# Patient Record
Sex: Female | Born: 1980 | Race: White | Hispanic: No | Marital: Married | Smoking: Never smoker
Health system: Southern US, Community
[De-identification: ages and names within clinical notes are randomized; demographics above are authoritative.]

## PROBLEM LIST (undated history)

## (undated) DIAGNOSIS — J45909 Unspecified asthma, uncomplicated: Secondary | ICD-10-CM

## (undated) DIAGNOSIS — A048 Other specified bacterial intestinal infections: Secondary | ICD-10-CM

## (undated) DIAGNOSIS — R102 Pelvic and perineal pain: Secondary | ICD-10-CM

## (undated) DIAGNOSIS — B9689 Other specified bacterial agents as the cause of diseases classified elsewhere: Secondary | ICD-10-CM

## (undated) DIAGNOSIS — R519 Headache, unspecified: Secondary | ICD-10-CM

## (undated) DIAGNOSIS — I10 Essential (primary) hypertension: Secondary | ICD-10-CM

## (undated) DIAGNOSIS — R51 Headache: Secondary | ICD-10-CM

## (undated) DIAGNOSIS — N760 Acute vaginitis: Secondary | ICD-10-CM

## (undated) HISTORY — PX: TONSILLECTOMY: SUR1361

## (undated) HISTORY — PX: TUBAL LIGATION: SHX77

## (undated) HISTORY — DX: Other specified bacterial intestinal infections: A04.8

## (undated) HISTORY — PX: OTHER SURGICAL HISTORY: SHX169

## (undated) HISTORY — PX: ANKLE FRACTURE SURGERY: SHX122

## (undated) HISTORY — DX: Other specified bacterial agents as the cause of diseases classified elsewhere: B96.89

## (undated) HISTORY — DX: Pelvic and perineal pain: R10.2

## (undated) HISTORY — DX: Acute vaginitis: N76.0

---

## 2000-03-27 DIAGNOSIS — F329 Major depressive disorder, single episode, unspecified: Secondary | ICD-10-CM | POA: Insufficient documentation

## 2000-12-31 ENCOUNTER — Emergency Department (HOSPITAL_COMMUNITY): Admission: EM | Admit: 2000-12-31 | Discharge: 2000-12-31 | Payer: Self-pay | Admitting: Emergency Medicine

## 2004-11-17 ENCOUNTER — Ambulatory Visit (HOSPITAL_COMMUNITY): Admission: RE | Admit: 2004-11-17 | Discharge: 2004-11-17 | Payer: Self-pay | Admitting: Family Medicine

## 2004-12-12 ENCOUNTER — Ambulatory Visit: Payer: Self-pay | Admitting: Orthopedic Surgery

## 2005-01-17 ENCOUNTER — Ambulatory Visit (HOSPITAL_COMMUNITY): Admission: RE | Admit: 2005-01-17 | Discharge: 2005-01-17 | Payer: Self-pay | Admitting: Pulmonary Disease

## 2005-08-24 ENCOUNTER — Ambulatory Visit (HOSPITAL_COMMUNITY): Admission: RE | Admit: 2005-08-24 | Discharge: 2005-08-24 | Payer: Self-pay | Admitting: Family Medicine

## 2006-08-20 ENCOUNTER — Ambulatory Visit (HOSPITAL_COMMUNITY): Admission: RE | Admit: 2006-08-20 | Discharge: 2006-08-20 | Payer: Self-pay | Admitting: *Deleted

## 2006-12-28 ENCOUNTER — Encounter (INDEPENDENT_AMBULATORY_CARE_PROVIDER_SITE_OTHER): Payer: Self-pay | Admitting: *Deleted

## 2006-12-28 ENCOUNTER — Emergency Department (HOSPITAL_COMMUNITY): Admission: EM | Admit: 2006-12-28 | Discharge: 2006-12-28 | Payer: Self-pay | Admitting: Emergency Medicine

## 2007-07-08 ENCOUNTER — Inpatient Hospital Stay (HOSPITAL_COMMUNITY): Admission: EM | Admit: 2007-07-08 | Discharge: 2007-07-09 | Payer: Self-pay | Admitting: Emergency Medicine

## 2007-07-08 ENCOUNTER — Encounter: Payer: Self-pay | Admitting: Obstetrics & Gynecology

## 2007-07-09 ENCOUNTER — Encounter: Payer: Self-pay | Admitting: Obstetrics & Gynecology

## 2007-10-31 ENCOUNTER — Other Ambulatory Visit: Admission: RE | Admit: 2007-10-31 | Discharge: 2007-10-31 | Payer: Self-pay | Admitting: Obstetrics and Gynecology

## 2007-12-23 ENCOUNTER — Ambulatory Visit (HOSPITAL_COMMUNITY): Admission: RE | Admit: 2007-12-23 | Discharge: 2007-12-23 | Payer: Self-pay | Admitting: Nephrology

## 2009-01-06 ENCOUNTER — Ambulatory Visit (HOSPITAL_COMMUNITY): Admission: RE | Admit: 2009-01-06 | Discharge: 2009-01-06 | Payer: Self-pay | Admitting: Family Medicine

## 2009-02-03 ENCOUNTER — Other Ambulatory Visit: Admission: RE | Admit: 2009-02-03 | Discharge: 2009-02-03 | Payer: Self-pay | Admitting: Obstetrics and Gynecology

## 2009-09-17 ENCOUNTER — Inpatient Hospital Stay (HOSPITAL_COMMUNITY): Admission: RE | Admit: 2009-09-17 | Discharge: 2009-09-19 | Payer: Self-pay | Admitting: Obstetrics & Gynecology

## 2009-09-17 ENCOUNTER — Encounter: Payer: Self-pay | Admitting: Obstetrics & Gynecology

## 2010-12-03 ENCOUNTER — Encounter: Payer: Self-pay | Admitting: Family Medicine

## 2010-12-05 ENCOUNTER — Encounter: Payer: Self-pay | Admitting: Family Medicine

## 2011-02-15 LAB — CBC
HCT: 29.1 % — ABNORMAL LOW (ref 36.0–46.0)
HCT: 32.6 % — ABNORMAL LOW (ref 36.0–46.0)
HCT: 35.8 % — ABNORMAL LOW (ref 36.0–46.0)
Hemoglobin: 11.2 g/dL — ABNORMAL LOW (ref 12.0–15.0)
Hemoglobin: 12.1 g/dL (ref 12.0–15.0)
Hemoglobin: 9.8 g/dL — ABNORMAL LOW (ref 12.0–15.0)
MCHC: 33.7 g/dL (ref 30.0–36.0)
MCHC: 33.7 g/dL (ref 30.0–36.0)
MCHC: 34.3 g/dL (ref 30.0–36.0)
MCV: 93.2 fL (ref 78.0–100.0)
MCV: 94 fL (ref 78.0–100.0)
MCV: 94.2 fL (ref 78.0–100.0)
Platelets: 58 10*3/uL — ABNORMAL LOW (ref 150–400)
Platelets: 99 10*3/uL — ABNORMAL LOW (ref 150–400)
RBC: 3.09 MIL/uL — ABNORMAL LOW (ref 3.87–5.11)
RBC: 3.47 MIL/uL — ABNORMAL LOW (ref 3.87–5.11)
RBC: 3.84 MIL/uL — ABNORMAL LOW (ref 3.87–5.11)
RDW: 13.5 % (ref 11.5–15.5)
RDW: 13.6 % (ref 11.5–15.5)
RDW: 13.9 % (ref 11.5–15.5)
WBC: 8.3 10*3/uL (ref 4.0–10.5)
WBC: 8.9 10*3/uL (ref 4.0–10.5)
WBC: 8.9 10*3/uL (ref 4.0–10.5)

## 2011-02-15 LAB — PLATELET COUNT: Platelets: 166 10*3/uL (ref 150–400)

## 2011-02-15 LAB — RPR: RPR Ser Ql: NONREACTIVE

## 2011-03-28 NOTE — Op Note (Signed)
NAMEJESSLY, LEBECK              ACCOUNT NO.:  [id number]   MEDICAL RECORD NO.:  [id number]          PATIENT TYPE:  INP   LOCATION:  A310                          FACILITY:  [facility]   PHYSICIAN:  Lazaro Arms, M.D.   DATE OF BIRTH:  1981/09/01   DATE OF PROCEDURE:  07/09/2007  DATE OF DISCHARGE:  07/09/2007                               OPERATIVE REPORT   PREOPERATIVE DIAGNOSES:  1. Incomplete miscarriage at 9 weeks' gestation.  2. Possible infected, versus septic, miscarriage.   POSTOPERATIVE DIAGNOSES:  1. Incomplete miscarriage at 9 weeks' gestation.  2. Possible infected, versus septic, miscarriage.   PROCEDURE:  Cervical dilatation with suction and sharp curettage.   SURGEON:  Lazaro Arms, M.D.   ANESTHESIA:  General endotracheal.   FINDINGS:  Patient was brought in with elevated temperature.  White  count was 9 after already having a miscarriage.  She was deciding  whether or not to have a D and C or not and she came in, in the  meantime, through the ER.  Ultrasound shows a lot of debris left in the  uterus.  She probably has passed a portion of the pregnancy, but  certainly not all of it.  As a result, she was admitted for D and C.  At  time of surgery, all of this was confirmed with a moderate amount of  products of conception.   DESCRIPTION OF OPERATION:  Patient was taken to the operating room,  placed in supine position, underwent general endotracheal anesthesia,  placed in dorsal lithotomy position, prepped and draped in the usual  sterile fashion.  A Graves speculum was placed.  Cervix was grasped.  Cervix was dilated serially to allow passage of a non-curved French  suction curet.  Several passes were made.  A large amount of tissue was  returned.  Gentle sharp curettage was performed with good uterine cry in  all areas.  A moderate amount of tissue was returned and sent to  pathology for evaluation.  Bleeding was appropriate and she was given  Methergine  IM.  She was awakened from anesthesia and taken to the  recovery room in good and stable condition.  She had already received  Levaquin IV up on the floor prior to having had surgery.      Lazaro Arms, M.D.  Electronically Signed    LHE/MEDQ  D:  08/02/2007  T:  08/02/2007  Job:  [id number]

## 2011-03-28 NOTE — Discharge Summary (Signed)
Annette Mitchell, Annette Mitchell              ACCOUNT NO.:  [id number]   MEDICAL RECORD NO.:  [id number]          PATIENT TYPE:  INP   LOCATION:  A310                          FACILITY:  [facility]   PHYSICIAN:  Lazaro Arms, M.D.   DATE OF BIRTH:  1981/01/27   DATE OF ADMISSION:  07/08/2007  DATE OF DISCHARGE:  08/26/2008LH                               DISCHARGE SUMMARY   DISCHARGE DIAGNOSES:  1. Incomplete abortion, infected.  2. Status post dilatation and curettage.   Please refer to the history and physical, as well as to the operative  report for details regarding admission to the hospital.   HOSPITAL COURSE:  Patient was admitted and underwent IV antibiotics for  probable septic incomplete AB.  She got this prior to going for D and C,  which was completed without difficulty.  Postoperatively, she did quite  well, she had no spike in fever.  Her abdominal exam was benign.  She  was discharged to home on oral Levaquin, after having received a final  IV dose, which should last for another 24 hours.  She was doing well.  Hemoglobin and hematocrit were stable, so she was discharged home on the  afternoon of her D and C, doing well.   She was given instructions and precautions for return and will be seen  in the office next week.      Lazaro Arms, M.D.  Electronically Signed     LHE/MEDQ  D:  08/02/2007  T:  08/02/2007  Job:  [id number]

## 2011-03-28 NOTE — H&P (Signed)
NAMEJAKAYLAH, Mitchell              ACCOUNT NO.:  [id number]   MEDICAL RECORD NO.:  [id number]          PATIENT TYPE:  INP   LOCATION:  A310                          FACILITY:  [facility]   PHYSICIAN:  Lazaro Arms, M.D.   DATE OF BIRTH:  1981-09-07   DATE OF ADMISSION:  07/08/2007  DATE OF DISCHARGE:  LH                              HISTORY & PHYSICAL   HISTORY:  Annette Mitchell is a 30 year old white female, gravida 4, para 2,  abortus 1, who was diagnosed last week on July 02, 2007, with a  pregnancy loss at eight weeks.  She had a positive sonogram on June 17, 2007, but there was a disproportion between the decidual tissue and the  sac size.  We repeated the ultrasound and there was a fetal pole but no  fetal cardiac activity.  She decided she wanted to be conservative in  her management and miscarried spontaneously.  She was given the option  to do a D&C, and she was also given the option to come back in a few  days and have that done; however, she decided to spontaneously wait and  miscarry.  She presented to the emergency room earlier this morning,  complaining of shaking chills and fever and bleeding and cramping.  She  had a white count of 13,400 and a temperature in the emergency room over  101 degrees.  Her uterus was quite tender on exam but in the midline  into the adnexa.  As a result, my presumption was that she had an  infection associated with incomplete AB, and was admitted and put on IV  antibiotics prophylactically.  I am going to get an ultrasound and see  what that shows and after she has been on antibiotics for about 24  hours, proceed with a D&C.   PAST MEDICAL HISTORY:  Significant for headaches, bipolar disorder,  asthma and she also had during this pregnancy proteinuria.  We do not  know the cause.  She did not have it back when she miscarried back in  the February and March time frame, but she had 2484 mg of protein in 24  hours that we obtained on June 19, 2007.   We did not know the source of  that.  I did labs, ANA which was negative.  A lupus anticoagulant was  negative.  All other auto-immune labs that I drew, anti-cardiolipin,  anti-phospholipid antibiotics all were negative.  That will be worked up  by Dr. Jorja Mitchell when we sort of settle down the pregnancy  issue.   PAST SURGICAL HISTORY:  A rod in her right thigh, tonsils and a C-  section, a plate in her right arm and a plate in her left ankle.   PAST OBSTETRICAL HISTORY:  She had a miscarriage earlier this year in  February/March time frame.  She had a C-section back in 1996, and a  vaginal delivery in 2002.   REVIEW OF SYSTEMS:  As per the HPI, otherwise negative.  HEENT:  Unremarkable.  Thyroid normal.  LUNGS:  Clear.  HEART:  Regular rhythm  without  murmurs, rubs or gallops.  BREASTS:  Without mass, discharge or  skin changes.  ABDOMEN:  Benign.  PELVIC:  Tender to palpation.  EXTREMITIES:  Warm without edema.  NEUROLOGIC:  Grossly intact.   LABORATORY DATA:  Blood type is A-positive.   IMPRESSION:  Possible septic abortion, versus incomplete abortion.   PLAN:  The patient is admitted for IV antibiotics, to cool down a  potential intra-uterine infection.  Will do an ultrasound and evaluate  that and depending upon what that shows, go forward.  Plans right now  are to do a D&C.  She is put on the schedule tomorrow for that.      Lazaro Arms, M.D.  Electronically Signed     LHE/MEDQ  D:  07/09/2007  T:  07/09/2007  Job:  [id number]

## 2011-03-31 NOTE — Consult Note (Signed)
Annette Mitchell, Annette Mitchell              ACCOUNT NO.:  [id number]   MEDICAL RECORD NO.:  [id number]          PATIENT TYPE:  EMS   LOCATION:  ED                            FACILITY:  [facility]   PHYSICIAN:  Tilda Burrow, M.D. DATE OF BIRTH:  06-Jul-1981   DATE OF CONSULTATION:  12/28/2006  DATE OF DISCHARGE:  12/28/2006                                 CONSULTATION   CHIEF COMPLAINT:  Pregnancy, 12 weeks. Bleeding tissue.   HISTORY OF PRESENT ILLNESS:  This is a 30 year old female seen in our  office for first trimester bleeding associated with partial previa. Was  seen in the emergency room after bleeding and cramping throughout the  day. Since her office visit 2 days ago, she had a gush of fluid  yesterday. Spoke to someone on call and was advised to simply follow at  home. She began to cramp and returned to our office where bleeding shows  passage of tissue as well as heavy bleeding. Cramping has improved.  Speculum exam to me reveals large amount of tissue in the os which upon  Valsalva maneuver can be expelled and extracted. Transvaginal ultrasound  by Jannifer Franklin shows that the uterus is essentially empty except for  a small clot in the internal os/lower uterine segment. The uterine  cavity itself is empty. The patient's blood type is Rh positive, A  positive by patient history. 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 She is comfortable with the  process of need to watch for fever. Is currently afebrile with a  temperature of 98.3 and is dramatically improved in her pain with the  expulsion of the tissue. She will go home for routine monitoring of  temperature and bleeding and follow up in one to two weeks in our  office.      Tilda Burrow, M.D.  Electronically Signed     JVF/MEDQ  D:  12/28/2006  T:  12/29/2006  Job:  [id number]

## 2011-03-31 NOTE — Consult Note (Signed)
[facility]  Patient:    Annette Mitchell, Annette Mitchell                       MRN: [id number] Proc. Date: 12/31/00 Adm. Date:  [id number] Disc. Date: [id number] Attending:  Cathren Laine                          Consultation Report  PHYSICIAN REQUESTING CONSULTATION:  Dr. Ward Givens.  CHIEF COMPLAINT:  Lower abdominal pain.  OBJECTIVE:  Patient is a 30 year old white female, gravida 2, para 1, approximately [redacted] weeks pregnant, who was seen in the emergency department for complaints of shortness of breath.  She has a history of asthma.  She has been treated by the emergency physician with inhaled albuterol and her breathing problems have improved; however, the patient was complaining of lower abdominal tightening and right lower quadrant pain.  The abdominal tightening has been intermittent, "every few minutes" (patient unable to give an exact estimation of frequency).  Prenatal care with Dr. Almetta Lovely in [place] has been previously uncomplicated.  OBSTETRICAL HISTORY:  Past obstetrical history significant for C-section for breech.  This pregnancy has been otherwise uncomplicated.  She reports active fetus.  No leakage of fluid or vaginal bleeding.  PAST MEDICAL HISTORY:  Asthma, as above; otherwise, negative.  PAST SURGICAL HISTORY:  C-section.  MEDICATIONS:  Wellbutrin and albuterol inhaler.  ALLERGIES:  None.  SOCIAL HISTORY:  No alcohol, tobacco or other drugs.  PHYSICAL EXAMINATION  VITAL SIGNS:  Blood pressure 151/84, pulse 129, respiratory rate 36.  Of note, patient had serial blood pressures in the emergency department and most ranged in the 110s/70s range.  GENERAL:  Patient is alert and oriented in no acute distress.  She is no longer complaining of difficulty breathing.  ABDOMEN:  Fundus is gravid consistent with [redacted] weeks pregnant.  No fundal tenderness.  PELVIC:  On cervical exam, cervix is closed and thick with a high presenting part.  No  bleeding noted on the examining glove.  EXTREMITIES:  Lower extremity reflexes are 3+.  No clonus.  ELECTRONIC FETAL MONITORING:  Fetal heart rate baseline 150s and reactive.  No contractions tracing on the tocometer.  ASSESSMENT 1. Thirty-one-week ______  with history of asthma who presented with    respiratory difficulty which has now been relieved.  Further disposition    regarding respiratory status per the emergency room physicians. 2. Thirty-one-week pregnancy, lower abdominal pressure.  No evidence for    preterm labor.  Labor precautions are given and recommend the patient    follow up with her primary obstetrician, Dr. Gilford Silvius, in [place] tomorrow.    Given her blood pressures were slightly elevated on presentation, will    check preeclampsia labs and urinalysis.  If all the above is normal, we    will discharge patient to home with followup with Dr. Gilford Silvius. DD:  12/31/00 TD:  01/01/01 Job: [id number]

## 2011-08-25 LAB — COMPREHENSIVE METABOLIC PANEL
ALT: 22
AST: 27
Albumin: 3 — ABNORMAL LOW
Alkaline Phosphatase: 84
BUN: 5 — ABNORMAL LOW
CO2: 27
Calcium: 8.8
Chloride: 102
Creatinine, Ser: 0.88
GFR calc Af Amer: >60
GFR calc non Af Amer: >60
Glucose, Bld: 126 — ABNORMAL HIGH
Potassium: 3.5
Sodium: 135
Total Bilirubin: 0.4
Total Protein: 6.3

## 2011-08-25 LAB — URINALYSIS, ROUTINE W REFLEX MICROSCOPIC
Bilirubin Urine: NEGATIVE
Glucose, UA: NEGATIVE
Ketones, ur: NEGATIVE
Nitrite: NEGATIVE
Protein, ur: 100 — AB
Specific Gravity, Urine: 1.005
Urobilinogen, UA: 0.2
pH: 7

## 2011-08-25 LAB — DIFFERENTIAL
Basophils Absolute: 0
Basophils Absolute: 0
Basophils Relative: 0
Eosinophils Absolute: 0.1
Eosinophils Relative: 0
Eosinophils Relative: 1
Lymphocytes Relative: 10 — ABNORMAL LOW
Lymphocytes Relative: 14
Lymphs Abs: 1.3
Lymphs Abs: 1.3
Monocytes Absolute: 0.7
Monocytes Relative: 5
Neutro Abs: 11.4 — ABNORMAL HIGH
Neutro Abs: 7.2
Neutrophils Relative %: 74
Neutrophils Relative %: 85 — ABNORMAL HIGH

## 2011-08-25 LAB — CBC
HCT: 27.5 — ABNORMAL LOW
HCT: 33.5 — ABNORMAL LOW
Hemoglobin: 11.5 — ABNORMAL LOW
MCHC: 34.3
MCV: 89.2
Platelets: 132 — ABNORMAL LOW
Platelets: 198
RBC: 3.75 — ABNORMAL LOW
RDW: 13.9
RDW: 14
WBC: 13.4 — ABNORMAL HIGH
WBC: 9.8

## 2011-08-25 LAB — WET PREP, GENITAL
Trich, Wet Prep: NONE SEEN
Yeast Wet Prep HPF POC: NONE SEEN

## 2011-08-25 LAB — CULTURE, BLOOD (ROUTINE X 2)
Culture: NO GROWTH
Report Status: 8302008

## 2011-08-25 LAB — HCG, QUANTITATIVE, PREGNANCY: hCG, Beta Chain, Quant, S: 2372 — ABNORMAL HIGH

## 2011-08-25 LAB — URINE MICROSCOPIC-ADD ON

## 2014-08-21 ENCOUNTER — Other Ambulatory Visit: Payer: Self-pay | Admitting: Internal Medicine

## 2015-01-26 ENCOUNTER — Other Ambulatory Visit (HOSPITAL_COMMUNITY): Payer: Self-pay | Admitting: Family Medicine

## 2015-01-26 DIAGNOSIS — K8020 Calculus of gallbladder without cholecystitis without obstruction: Secondary | ICD-10-CM

## 2015-01-27 ENCOUNTER — Ambulatory Visit (HOSPITAL_COMMUNITY)
Admission: RE | Admit: 2015-01-27 | Discharge: 2015-01-27 | Disposition: A | Discharge status: Home / Self Care | Payer: Managed Care, Other (non HMO) | Source: Ambulatory Visit | Attending: Family Medicine | Admitting: Family Medicine

## 2015-01-27 DIAGNOSIS — E669 Obesity, unspecified: Secondary | ICD-10-CM | POA: Insufficient documentation

## 2015-01-27 DIAGNOSIS — K8020 Calculus of gallbladder without cholecystitis without obstruction: Secondary | ICD-10-CM

## 2015-01-27 DIAGNOSIS — K760 Fatty (change of) liver, not elsewhere classified: Secondary | ICD-10-CM | POA: Diagnosis not present

## 2015-01-27 DIAGNOSIS — R110 Nausea: Secondary | ICD-10-CM | POA: Diagnosis not present

## 2015-01-27 DIAGNOSIS — R1011 Right upper quadrant pain: Secondary | ICD-10-CM | POA: Diagnosis present

## 2015-01-28 ENCOUNTER — Other Ambulatory Visit (HOSPITAL_COMMUNITY): Payer: Self-pay

## 2015-02-08 ENCOUNTER — Other Ambulatory Visit (HOSPITAL_COMMUNITY): Payer: Self-pay | Admitting: Family Medicine

## 2015-02-08 DIAGNOSIS — R1011 Right upper quadrant pain: Secondary | ICD-10-CM

## 2015-02-10 ENCOUNTER — Ambulatory Visit (HOSPITAL_COMMUNITY): Payer: Managed Care, Other (non HMO)

## 2015-04-02 ENCOUNTER — Encounter: Payer: Self-pay | Admitting: Adult Health

## 2015-04-02 ENCOUNTER — Other Ambulatory Visit: Payer: Self-pay | Admitting: Adult Health

## 2015-04-06 ENCOUNTER — Encounter (HOSPITAL_COMMUNITY): Payer: Self-pay

## 2015-04-06 ENCOUNTER — Encounter (HOSPITAL_COMMUNITY)
Admission: RE | Admit: 2015-04-06 | Discharge: 2015-04-06 | Disposition: A | Discharge status: Home / Self Care | Payer: Managed Care, Other (non HMO) | Source: Ambulatory Visit | Attending: Family Medicine | Admitting: Family Medicine

## 2015-04-06 DIAGNOSIS — R109 Unspecified abdominal pain: Secondary | ICD-10-CM | POA: Diagnosis not present

## 2015-04-06 DIAGNOSIS — R1011 Right upper quadrant pain: Secondary | ICD-10-CM

## 2015-04-06 HISTORY — DX: Essential (primary) hypertension: I10

## 2015-04-06 HISTORY — DX: Unspecified asthma, uncomplicated: J45.909

## 2015-04-06 MED ORDER — STERILE WATER FOR INJECTION IJ SOLN
INTRAMUSCULAR | Status: AC
  Administered 2015-04-06: 2.41 mL
  Filled 2015-04-06: qty 10

## 2015-04-06 MED ORDER — STERILE WATER FOR INJECTION IJ SOLN
INTRAMUSCULAR | Status: AC
  Filled 2015-04-06: qty 10

## 2015-04-06 MED ORDER — SINCALIDE 5 MCG IJ SOLR
INTRAMUSCULAR | Status: AC
  Administered 2015-04-06: 2.41 ug
  Filled 2015-04-06: qty 5

## 2015-04-06 MED ORDER — TECHNETIUM TC 99M MEBROFENIN IV KIT
5.0000 | PACK | Freq: Once | INTRAVENOUS | Status: AC | PRN
  Administered 2015-04-06: 5.1 via INTRAVENOUS

## 2015-04-13 ENCOUNTER — Other Ambulatory Visit: Payer: Self-pay | Admitting: Adult Health

## 2015-05-10 NOTE — H&P (Signed)
  NTS SOAP Note  Vital Signs:  Vitals as of: 06/09/2015: Systolic 161: Diastolic 100: Heart Rate 93: Temp 98.7F: Height 79ft 1in: Weight 265Lbs 0 Ounces: Pain Level 3: BMI 50.07  BMI : 50.07 kg/m2  Subjective: This 34 year old female presents for of right upper quadrant abdominal pain and nausea.  Does also have some dysphagia and reflux disease.  Feels that sometimes her food gets stuck in the epigastric region.  ? hiatal hernia.  Does have some fatty food intolerance.  No fever, chills, jaundice.  U/S of gallbladder negative for cholelithiasis.  Review of Symptoms:  Constitutional:unremarkable   Head:unremarkable Eyes:unremarkable   Nose/Mouth/Throat:unremarkable Cardiovascular:  unremarkable Respiratory:unremarkable Gastrointestinabdominal pain, nausea, vomiting, diarrhea, heartburn, dyspepsia, dysphagia Genitourinary:unremarkable   Musculoskeletal:unremarkable Skin:unremarkable Hematolgic/Lymphatic:unremarkable   Allergic/Immunologic:unremarkable   Past Medical History:  Reviewed  Past Medical History  Surgical History: BTL, bone surgery Medical Problems: anxiety Psychiatric History: bipolar disorder Allergies: betadine Medications: seroquel, ambien, lamictal, ativan   Social History:Reviewed  Social History  Preferred Language: English Race:  White Ethnicity: Not Hispanic / Latino Age: 74 year Marital Status:  M Alcohol: no   Smoking Status: Never smoker reviewed on 04/01/2015 Functional Status reviewed on 04/01/2015 ------------------------------------------------ Bathing: Normal Cooking: Normal Dressing: Normal Driving: Normal Eating: Normal Managing Meds: Normal Oral Care: Normal Shopping: Normal Toileting: Normal Transferring: Normal Walking: Normal Cognitive Status reviewed on 04/01/2015 ------------------------------------------------ Attention: Normal Decision Making: Normal Language: Normal Memory: Normal Motor:  Normal Perception: Normal Problem Solving: Normal Visual and Spatial: Normal   Family History:Reviewed  Family Health History Mother, Living; Bipolar disorder (manic depressive);  Father     Objective Information: General:Well appearing, well nourished in no distress. Neck:Supple without lymphadenopathy.  Heart:RRR, no murmur Lungs:  CTA bilaterally, no wheezes, rhonchi, rales.  Breathing unlabored. Abdomen:Soft, slightly tender in right upper quadrant and epigastric region to palpation, ND, no HSM, no masses.  Assessment:Abdominal pain, ?biliary colic  Diagnoses: 789.01  R10.11 Right upper quadrant pain (Right upper quadrant pain)  Procedures: 11914 - OFFICE OUTPATIENT NEW 30 MINUTES    Plan:  For EGD with biopsy on 06/01/15.  The risks and benefits of the procedure including bleeding and perforation were fully explained to the patient, who gives informed consent.   Follow-up:Pending procedure

## 2015-06-01 ENCOUNTER — Encounter (HOSPITAL_COMMUNITY): Payer: Self-pay | Admitting: *Deleted

## 2015-06-01 ENCOUNTER — Encounter (HOSPITAL_COMMUNITY)
Admission: RE | Disposition: A | Discharge status: Home / Self Care | Payer: Self-pay | Source: Ambulatory Visit | Attending: General Surgery

## 2015-06-01 ENCOUNTER — Ambulatory Visit (HOSPITAL_COMMUNITY)
Admission: RE | Admit: 2015-06-01 | Discharge: 2015-06-01 | Disposition: A | Discharge status: Home / Self Care | Payer: Managed Care, Other (non HMO) | Source: Ambulatory Visit | Attending: General Surgery | Admitting: General Surgery

## 2015-06-01 DIAGNOSIS — J45909 Unspecified asthma, uncomplicated: Secondary | ICD-10-CM | POA: Insufficient documentation

## 2015-06-01 DIAGNOSIS — I10 Essential (primary) hypertension: Secondary | ICD-10-CM | POA: Insufficient documentation

## 2015-06-01 DIAGNOSIS — K2970 Gastritis, unspecified, without bleeding: Secondary | ICD-10-CM | POA: Diagnosis not present

## 2015-06-01 DIAGNOSIS — F419 Anxiety disorder, unspecified: Secondary | ICD-10-CM | POA: Insufficient documentation

## 2015-06-01 DIAGNOSIS — R1013 Epigastric pain: Secondary | ICD-10-CM | POA: Insufficient documentation

## 2015-06-01 DIAGNOSIS — K209 Esophagitis, unspecified: Secondary | ICD-10-CM | POA: Insufficient documentation

## 2015-06-01 DIAGNOSIS — F319 Bipolar disorder, unspecified: Secondary | ICD-10-CM | POA: Insufficient documentation

## 2015-06-01 DIAGNOSIS — R1310 Dysphagia, unspecified: Secondary | ICD-10-CM | POA: Insufficient documentation

## 2015-06-01 HISTORY — DX: Headache, unspecified: R51.9

## 2015-06-01 HISTORY — DX: Headache: R51

## 2015-06-01 HISTORY — PX: BIOPSY: SHX5522

## 2015-06-01 HISTORY — PX: ESOPHAGOGASTRODUODENOSCOPY: SHX5428

## 2015-06-01 LAB — CLOTEST (H. PYLORI), BIOPSY: Helicobacter screen: POSITIVE — AB

## 2015-06-01 SURGERY — EGD (ESOPHAGOGASTRODUODENOSCOPY)
Anesthesia: Moderate Sedation

## 2015-06-01 MED ORDER — SODIUM CHLORIDE 0.9 % IV SOLN
INTRAVENOUS | Status: DC
  Administered 2015-06-01: 1000 mL via INTRAVENOUS

## 2015-06-01 MED ORDER — BUTAMBEN-TETRACAINE-BENZOCAINE 2-2-14 % EX AERO
INHALATION_SPRAY | CUTANEOUS | Status: DC | PRN
  Administered 2015-06-01: 2 via TOPICAL

## 2015-06-01 MED ORDER — MIDAZOLAM HCL 5 MG/5ML IJ SOLN
INTRAMUSCULAR | Status: DC | PRN
  Administered 2015-06-01: 2 mg via INTRAVENOUS

## 2015-06-01 MED ORDER — SODIUM CHLORIDE 0.9 % IJ SOLN
INTRAMUSCULAR | Status: AC
  Filled 2015-06-01: qty 3

## 2015-06-01 MED ORDER — MIDAZOLAM HCL 5 MG/5ML IJ SOLN
INTRAMUSCULAR | Status: AC
  Filled 2015-06-01: qty 5

## 2015-06-01 MED ORDER — MEPERIDINE HCL 50 MG/ML IJ SOLN
INTRAMUSCULAR | Status: DC | PRN
  Administered 2015-06-01: 50 mg via INTRAVENOUS

## 2015-06-01 MED ORDER — OMEPRAZOLE 20 MG PO CPDR: 20.0000 mg | DELAYED_RELEASE_CAPSULE | Freq: Every day | ORAL | Status: DC

## 2015-06-01 MED ORDER — MEPERIDINE HCL 50 MG/ML IJ SOLN
INTRAMUSCULAR | Status: AC
  Filled 2015-06-01: qty 1

## 2015-06-01 NOTE — Interval H&P Note (Signed)
History and Physical Interval Note:  06/01/2015 8:02 AM  Annette Mitchell  has presented today for surgery, with the diagnosis of epigastric pain  The various methods of treatment have been discussed with the patient and family. After consideration of risks, benefits and other options for treatment, the patient has consented to  Procedure(s): ESOPHAGOGASTRODUODENOSCOPY (EGD) (N/A) BIOPSY (N/A) as a surgical intervention .  The patient's history has been reviewed, patient examined, no change in status, stable for surgery.  I have reviewed the patient's chart and labs.  Questions were answered to the patient's satisfaction.     [name],Gracey Tolle A

## 2015-06-01 NOTE — Op Note (Signed)
[facility] [address] [place], [id number]   ENDOSCOPY PROCEDURE REPORT  PATIENT: Annette KalesCassada, [name]  MR#: [id number] BIRTHDATE: 1981/07/03 , 34  yrs. old GENDER: female ENDOSCOPIST: Franky MachoMark Shawnmichael Parenteau, MD REFERRED BY:  Assunta FoundJohn Golding, M.D. PROCEDURE DATE:  06/01/2015 PROCEDURE:  EGD w/ biopsy for H.pylori ASA CLASS:     Class II INDICATIONS:  epigastric pain. MEDICATIONS: Demerol 50 mg IV and Versed 2 mg IV TOPICAL ANESTHETIC: Cetacaine Spray  DESCRIPTION OF PROCEDURE: After the risks benefits and alternatives of the procedure were thoroughly explained, informed consent was obtained.  The EG-[id number] ([id number] endoscope was introduced through the mouth and advanced to the second portion of the duodenum , Without limitations.  The instrument was slowly withdrawn as the mucosa was fully examined. Estimated blood loss is zero unless otherwise noted in this procedure report.    DUODENUM: The duodenal mucosa showed no abnormalities.   STOMACH: There was mild non-erosive gastritis in the gastric antrum.  A biopsy was performed using cold forceps.  Sample obtained for helicobacter pylori testing.   Mild distal esophagitis, no frank ulcerations.  No hiatal hernia appreciated.   Some retained food in stomach.  Retroflexed views revealed no abnormalities.     The scope was then withdrawn from the patient and the procedure completed.  COMPLICATIONS: There were no immediate complications.  ENDOSCOPIC IMPRESSION: 1.   The duodenal mucosa showed no abnormalities 2.   There was mild non-erosive gastritis in the gastric antrum; biopsy was performed 3.   Mild distal esophagitis, no frank ulcerations.  No hiatal hernia appreciated 4.   Some retained food in stomach  RECOMMENDATIONS:   REPEAT EXAM:  eSigned:  Franky MachoMark Langdon Crosson, MD 06/01/2015 8:17 AM    CC:  CPT CODES: ICD CODES:  The ICD and CPT codes recommended by this software are interpretations from the data that the  clinical staff has captured with the software.  The verification of the translation of this report to the ICD and CPT codes and modifiers is the sole responsibility of the health care institution and practicing physician where this report was generated.  [employer] will not be held responsible for the validity of the ICD and CPT codes included on this report.  AMA assumes no liability for data contained or not contained herein. CPT is a Publishing rights managerregistered trademark of the [employer].

## 2015-06-01 NOTE — Discharge Instructions (Signed)
Gastroesophageal Reflux Disease, Adult Gastroesophageal reflux disease (GERD) happens when acid from your stomach flows up into the esophagus. When acid comes in contact with the esophagus, the acid causes soreness (inflammation) in the esophagus. Over time, GERD may create small holes (ulcers) in the lining of the esophagus. CAUSES   Increased body weight. This puts pressure on the stomach, making acid rise from the stomach into the esophagus.  Smoking. This increases acid production in the stomach.  Drinking alcohol. This causes decreased pressure in the lower esophageal sphincter (valve or ring of muscle between the esophagus and stomach), allowing acid from the stomach into the esophagus.  Late evening meals and a full stomach. This increases pressure and acid production in the stomach.  A malformed lower esophageal sphincter. Sometimes, no cause is found. SYMPTOMS   Burning pain in the lower part of the mid-chest behind the breastbone and in the mid-stomach area. This may occur twice a week or more often.  Trouble swallowing.  Sore throat.  Dry cough.  Asthma-like symptoms including chest tightness, shortness of breath, or wheezing. DIAGNOSIS  Your caregiver may be able to diagnose GERD based on your symptoms. In some cases, X-rays and other tests may be done to check for complications or to check the condition of your stomach and esophagus. TREATMENT  Your caregiver may recommend over-the-counter or prescription medicines to help decrease acid production. Ask your caregiver before starting or adding any new medicines.  HOME CARE INSTRUCTIONS   Change the factors that you can control. Ask your caregiver for guidance concerning weight loss, quitting smoking, and alcohol consumption.  Avoid foods and drinks that make your symptoms worse, such as:  Caffeine or alcoholic drinks.  Chocolate.  Peppermint or mint flavorings.  Garlic and onions.  Spicy foods.  Citrus fruits,  such as oranges, lemons, or limes.  Tomato-based foods such as sauce, chili, salsa, and pizza.  Fried and fatty foods.  Avoid lying down for the 3 hours prior to your bedtime or prior to taking a nap.  Eat small, frequent meals instead of large meals.  Wear loose-fitting clothing. Do not wear anything tight around your waist that causes pressure on your stomach.  Raise the head of your bed 6 to 8 inches with wood blocks to help you sleep. Extra pillows will not help.  Only take over-the-counter or prescription medicines for pain, discomfort, or fever as directed by your caregiver.  Do not take aspirin, ibuprofen, or other nonsteroidal anti-inflammatory drugs (NSAIDs). SEEK IMMEDIATE MEDICAL CARE IF:   You have pain in your arms, neck, jaw, teeth, or back.  Your pain increases or changes in intensity or duration.  You develop nausea, vomiting, or sweating (diaphoresis).  You develop shortness of breath, or you faint.  Your vomit is green, yellow, black, or looks like coffee grounds or blood.  Your stool is red, bloody, or black. These symptoms could be signs of other problems, such as heart disease, gastric bleeding, or esophageal bleeding. MAKE SURE YOU:   Understand these instructions.  Will watch your condition.  Will get help right away if you are not doing well or get worse. Document Released: 08/09/2005 Document Revised: 01/22/2012 Document Reviewed: 05/19/2011 [facility] Patient Information 2015 [place]. This information is not intended to replace advice given to you by your health care provider. Make sure you discuss any questions you have with your health care provider. Esophagogastroduodenoscopy Care After Refer to this sheet in the next few weeks. These instructions provide you  with information on caring for yourself after your procedure. Your caregiver may also give you more specific instructions. Your treatment has been planned according to current medical  practices, but problems sometimes occur. Call your caregiver if you have any problems or questions after your procedure.  HOME CARE INSTRUCTIONS  Do not eat or drink anything until the numbing medicine (local anesthetic) has worn off and your gag reflex has returned. You will know that the local anesthetic has worn off when you can swallow comfortably.  Do not drive for 12 hours after the procedure or as directed by your caregiver.  Only take medicines as directed by your caregiver. SEEK MEDICAL CARE IF:   You cannot stop coughing.  You are not urinating at all or less than usual. SEEK IMMEDIATE MEDICAL CARE IF:  You have difficulty swallowing.  You cannot eat or drink.  You have worsening throat or chest pain.  You have dizziness, lightheadedness, or you faint.  You have nausea or vomiting.  You have chills.  You have a fever.  You have severe abdominal pain.  You have black, tarry, or bloody stools. Document Released: 10/16/2012 Document Reviewed: 10/16/2012 [facility] Patient Information 2015 [place], [place]. This information is not intended to replace advice given to you by your health care provider. Make sure you discuss any questions you have with your health care provider.

## 2015-06-02 ENCOUNTER — Encounter (HOSPITAL_COMMUNITY): Payer: Self-pay | Admitting: General Surgery

## 2015-06-29 ENCOUNTER — Ambulatory Visit: Payer: Self-pay | Admitting: Women's Health

## 2015-06-30 ENCOUNTER — Ambulatory Visit (INDEPENDENT_AMBULATORY_CARE_PROVIDER_SITE_OTHER): Payer: Managed Care, Other (non HMO) | Admitting: Adult Health

## 2015-06-30 ENCOUNTER — Encounter: Payer: Self-pay | Admitting: Adult Health

## 2015-06-30 VITALS — BP 102/80 | HR 72 | Ht 62.0 in | Wt 268.0 lb

## 2015-06-30 DIAGNOSIS — R102 Pelvic and perineal pain: Secondary | ICD-10-CM

## 2015-06-30 DIAGNOSIS — N941 Dyspareunia: Secondary | ICD-10-CM

## 2015-06-30 DIAGNOSIS — A499 Bacterial infection, unspecified: Secondary | ICD-10-CM

## 2015-06-30 DIAGNOSIS — B9689 Other specified bacterial agents as the cause of diseases classified elsewhere: Secondary | ICD-10-CM

## 2015-06-30 DIAGNOSIS — IMO0002 Reserved for concepts with insufficient information to code with codable children: Secondary | ICD-10-CM | POA: Insufficient documentation

## 2015-06-30 DIAGNOSIS — N760 Acute vaginitis: Secondary | ICD-10-CM | POA: Diagnosis not present

## 2015-06-30 HISTORY — DX: Pelvic and perineal pain: R10.2

## 2015-06-30 HISTORY — DX: Other specified bacterial agents as the cause of diseases classified elsewhere: B96.89

## 2015-06-30 LAB — POCT URINALYSIS DIPSTICK
Glucose, UA: NEGATIVE
Leukocytes, UA: NEGATIVE
NITRITE UA: NEGATIVE
PROTEIN UA: NEGATIVE
RBC UA: NEGATIVE

## 2015-06-30 LAB — POCT WET PREP (WET MOUNT): WBC, Wet Prep HPF POC: POSITIVE

## 2015-06-30 MED ORDER — METRONIDAZOLE 500 MG PO TABS: 500.0000 mg | ORAL_TABLET | Freq: Two times a day (BID) | ORAL | Status: DC

## 2015-06-30 NOTE — Progress Notes (Signed)
Subjective:     Patient ID: Annette Mitchell, female   DOB: 05/11/1981, 34 y.o.   MRN: [id number]  HPI Annette Mitchell is a 34 year old white female, married in complaining of vaginal pain and pain with sex, for last 2-3 months, no known trauma, even tampon hurts.Vagina even hurts after peeing at times.  Review of Systems Patient denies any headaches, hearing loss, fatigue, blurred vision, shortness of breath, chest pain, abdominal pain, problems with bowel movements, urination, . No joint pain or mood swings.See HPI for positives. Reviewed past medical,surgical, social and family history. Reviewed medications and allergies.     Objective:   Physical Exam BP 102/80 mmHg  Pulse 72  Ht  (1.575 m)  Wt 268 lb (121.564 kg)  BMI 49.01 kg/m2  LMP 08/01/2016Urine negative. Skin warm and dry.Pelvic: external genitalia is normal in appearance no lesions, vagina: white discharge with slight odor, right side wall tender,urethra has no lesions or masses noted, cervix:smooth and bulbous, uterus: normal size, shape and contour, mildly tender, no masses felt, adnexa: no masses or tenderness noted. Bladder is non tender and no masses felt. Wet prep: + for clue cells and +WBCs. GC/CHL obtained. Says hurts after exam now, like after sex.    Assessment:     Vaginal pain Dyspareunia BV    Plan:    GC/CHL sent Rx flagyl 500 mg 1 bid x 7 days, no alcohol, review handout on BV   No sex Return in 2 weeks for pap and physical and fasting labs

## 2015-06-30 NOTE — Patient Instructions (Addendum)
Bacterial Vaginosis Bacterial vaginosis is a vaginal infection that occurs when the normal balance of bacteria in the vagina is disrupted. It results from an overgrowth of certain bacteria. This is the most common vaginal infection in women of childbearing age. Treatment is important to prevent complications, especially in pregnant women, as it can cause a premature delivery. CAUSES  Bacterial vaginosis is caused by an increase in harmful bacteria that are normally present in smaller amounts in the vagina. Several different kinds of bacteria can cause bacterial vaginosis. However, the reason that the condition develops is not fully understood. RISK FACTORS Certain activities or behaviors can put you at an increased risk of developing bacterial vaginosis, including:  Having a new sex partner or multiple sex partners.  Douching.  Using an intrauterine device (IUD) for contraception. Women do not get bacterial vaginosis from toilet seats, bedding, swimming pools, or contact with objects around them. SIGNS AND SYMPTOMS  Some women with bacterial vaginosis have no signs or symptoms. Common symptoms include:  Grey vaginal discharge.  A fishlike odor with discharge, especially after sexual intercourse.  Itching or burning of the vagina and vulva.  Burning or pain with urination. DIAGNOSIS  Your health care provider will take a medical history and examine the vagina for signs of bacterial vaginosis. A sample of vaginal fluid may be taken. Your health care provider will look at this sample under a microscope to check for bacteria and abnormal cells. A vaginal pH test may also be done.  TREATMENT  Bacterial vaginosis may be treated with antibiotic medicines. These may be given in the form of a pill or a vaginal cream. A second round of antibiotics may be prescribed if the condition comes back after treatment.  HOME CARE INSTRUCTIONS   Only take over-the-counter or prescription medicines as  directed by your health care provider.  If antibiotic medicine was prescribed, take it as directed. Make sure you finish it even if you start to feel better.  Do not have sex until treatment is completed.  Tell all sexual partners that you have a vaginal infection. They should see their health care provider and be treated if they have problems, such as a mild rash or itching.  Practice safe sex by using condoms and only having one sex partner. SEEK MEDICAL CARE IF:   Your symptoms are not improving after 3 days of treatment.  You have increased discharge or pain.  You have a fever. MAKE SURE YOU:   Understand these instructions.  Will watch your condition.  Will get help right away if you are not doing well or get worse. FOR MORE INFORMATION  Centers for Disease Control and Prevention, Division of STD Prevention: [employer] American Sexual Health Association (ASHA): www.ashastd.org  Document Released: 10/30/2005 Document Revised: 08/20/2013 Document Reviewed: 06/11/2013 [facility] Patient Information 2015 [place], [place]. This information is not intended to replace advice given to you by your health care provider. Make sure you discuss any questions you have with your health care provider. No alcohol No sex  Return in 2 weeks for pap and physical , and fasting labs

## 2015-07-02 LAB — GC/CHLAMYDIA PROBE AMP
Chlamydia trachomatis, NAA: NEGATIVE
NEISSERIA GONORRHOEAE BY PCR: NEGATIVE

## 2015-07-14 ENCOUNTER — Encounter: Payer: Self-pay | Admitting: Adult Health

## 2015-07-14 ENCOUNTER — Other Ambulatory Visit: Payer: Managed Care, Other (non HMO) | Admitting: Adult Health

## 2015-07-26 ENCOUNTER — Encounter: Payer: Self-pay | Admitting: Adult Health

## 2015-07-26 ENCOUNTER — Ambulatory Visit (INDEPENDENT_AMBULATORY_CARE_PROVIDER_SITE_OTHER): Payer: Managed Care, Other (non HMO) | Admitting: Adult Health

## 2015-07-26 ENCOUNTER — Other Ambulatory Visit (HOSPITAL_COMMUNITY)
Admission: RE | Admit: 2015-07-26 | Discharge: 2015-07-26 | Disposition: A | Discharge status: Home / Self Care | Payer: Managed Care, Other (non HMO) | Source: Ambulatory Visit | Attending: Adult Health | Admitting: Adult Health

## 2015-07-26 VITALS — BP 116/78 | HR 100 | Ht 63.0 in | Wt 261.0 lb

## 2015-07-26 DIAGNOSIS — Z1151 Encounter for screening for human papillomavirus (HPV): Secondary | ICD-10-CM | POA: Insufficient documentation

## 2015-07-26 DIAGNOSIS — Z01419 Encounter for gynecological examination (general) (routine) without abnormal findings: Secondary | ICD-10-CM | POA: Diagnosis not present

## 2015-07-26 DIAGNOSIS — R102 Pelvic and perineal pain unspecified side: Secondary | ICD-10-CM

## 2015-07-26 DIAGNOSIS — Z113 Encounter for screening for infections with a predominantly sexual mode of transmission: Secondary | ICD-10-CM | POA: Insufficient documentation

## 2015-07-26 DIAGNOSIS — IMO0002 Reserved for concepts with insufficient information to code with codable children: Secondary | ICD-10-CM

## 2015-07-26 NOTE — Patient Instructions (Signed)
Get [place] and labs this week Physical in  1year Mammogram at 38

## 2015-07-26 NOTE — Progress Notes (Signed)
Patient ID: Annette Mitchell, female   DOB: 1981/09/16, 34 y.o.   MRN: [id number] History of Present Illness: Annette Mitchell is a 34 year old white female, married in for well woman gyn exam and pap, last pap 2010.She was recently treated for BV and pain with sex and vagina pain, was better but had sex and it hurts again.She wants labs.Has eaten today.   Current Medications, Allergies, Past Medical History, Past Surgical History, Family History and Social History were reviewed in [name] record.     Review of Systems: Patient denies any headaches, hearing loss, fatigue, blurred vision, shortness of breath, chest pain, abdominal pain, problems with bowel movements, urination. No joint pain or mood swings.See HPI for positives.    Physical Exam:BP 116/78 mmHg  Pulse 100  Ht  (1.6 m)  Wt 261 lb (118.389 kg)  BMI 46.25 kg/m2  LMP 07/15/2015 General:  Well developed, well nourished, no acute distress Skin:  Warm and dry Neck:  Midline trachea, normal thyroid, good ROM, no lymphadenopathy Lungs; Clear to auscultation bilaterally Breast:  No dominant palpable mass, retraction, or nipple discharge Cardiovascular: Regular rate and rhythm Abdomen:  Soft, non tender, no hepatosplenomegaly Pelvic:  External genitalia is normal in appearance, no lesions.  The vagina is normal in appearance,has pain with palpation at 7 and 11 o'clock at sacro spinous logaments and urethra is tender,  Urethra has no lesions or masses. The cervix is bulbous.Pap performed with HPV and GC/CHL  Uterus is felt to be normal size, shape, and contour,mildly tender.  No adnexal masses or tenderness noted.Bladder is non tender, no masses felt Extremities/musculoskeletal:  No swelling or varicosities noted, no clubbing or cyanosis Psych:  No mood changes, alert and cooperative,seems happy Discussed will get [place] to assess uterus,and then may get appt with MD to discuss possible surgical  options.  Impression: Well woman gyn exam with pap Vaginal pain  Dyspareunia     Plan: Return in 2 days for Gyn [place] and labs fasting, Check CBC,CMP,TSH and lipids,HIV,RPR and HSV2 Physical in 1 year Mammogram at 34

## 2015-07-27 LAB — CYTOLOGY - PAP

## 2015-07-29 ENCOUNTER — Other Ambulatory Visit: Payer: Self-pay | Admitting: Adult Health

## 2015-07-29 ENCOUNTER — Ambulatory Visit (INDEPENDENT_AMBULATORY_CARE_PROVIDER_SITE_OTHER): Payer: Managed Care, Other (non HMO)

## 2015-07-29 ENCOUNTER — Other Ambulatory Visit: Payer: Managed Care, Other (non HMO)

## 2015-07-29 DIAGNOSIS — IMO0002 Reserved for concepts with insufficient information to code with codable children: Secondary | ICD-10-CM

## 2015-07-29 DIAGNOSIS — N941 Dyspareunia: Secondary | ICD-10-CM | POA: Diagnosis not present

## 2015-07-29 DIAGNOSIS — R102 Pelvic and perineal pain: Secondary | ICD-10-CM

## 2015-07-29 DIAGNOSIS — Z139 Encounter for screening, unspecified: Secondary | ICD-10-CM

## 2015-07-29 DIAGNOSIS — Z113 Encounter for screening for infections with a predominantly sexual mode of transmission: Secondary | ICD-10-CM

## 2015-07-29 NOTE — Progress Notes (Signed)
PELVIC US TA/TV, normal anteverted uterus,normal ov's bilat (mobile),no free fluid seen, EEC 5.24mm

## 2015-07-30 LAB — LIPID PANEL
CHOL/HDL RATIO: 4.5 ratio — AB (ref 0.0–4.4)
CHOLESTEROL TOTAL: 206 mg/dL — AB (ref 100–199)
HDL: 46 mg/dL (ref >39)
LDL CALC: 130 mg/dL — AB (ref 0–99)
Triglycerides: 151 mg/dL — ABNORMAL HIGH (ref 0–149)
VLDL Cholesterol Cal: 30 mg/dL (ref 5–40)

## 2015-07-30 LAB — COMPREHENSIVE METABOLIC PANEL
A/G RATIO: 1.5 (ref 1.1–2.5)
ALBUMIN: 4.4 g/dL (ref 3.5–5.5)
ALT: 22 IU/L (ref 0–32)
AST: 19 IU/L (ref 0–40)
Alkaline Phosphatase: 119 IU/L — ABNORMAL HIGH (ref 39–117)
BUN/Creatinine Ratio: 11 (ref 8–20)
BUN: 8 mg/dL (ref 6–20)
Bilirubin Total: 0.3 mg/dL (ref 0.0–1.2)
CO2: 24 mmol/L (ref 18–29)
Calcium: 9.6 mg/dL (ref 8.7–10.2)
Chloride: 100 mmol/L (ref 97–108)
Creatinine, Ser: 0.73 mg/dL (ref 0.57–1.00)
GFR calc non Af Amer: 106 mL/min/{1.73_m2} (ref >59)
GFR, EST AFRICAN AMERICAN: 123 mL/min/{1.73_m2} (ref >59)
Globulin, Total: 3 g/dL (ref 1.5–4.5)
Glucose: 92 mg/dL (ref 65–99)
Potassium: 4 mmol/L (ref 3.5–5.2)
Sodium: 140 mmol/L (ref 134–144)
TOTAL PROTEIN: 7.4 g/dL (ref 6.0–8.5)

## 2015-07-30 LAB — CBC
HEMATOCRIT: 40 % (ref 34.0–46.6)
HEMOGLOBIN: 13.2 g/dL (ref 11.1–15.9)
MCH: 29.4 pg (ref 26.6–33.0)
MCHC: 33 g/dL (ref 31.5–35.7)
MCV: 89 fL (ref 79–97)
Platelets: 131 10*3/uL — ABNORMAL LOW (ref 150–379)
RBC: 4.49 x10E6/uL (ref 3.77–5.28)
RDW: 13.6 % (ref 12.3–15.4)
WBC: 6.4 10*3/uL (ref 3.4–10.8)

## 2015-07-30 LAB — HSV 2 ANTIBODY, IGG

## 2015-07-30 LAB — HIV ANTIBODY (ROUTINE TESTING W REFLEX): HIV SCREEN 4TH GENERATION: NONREACTIVE

## 2015-07-30 LAB — RPR: RPR: NONREACTIVE

## 2015-07-30 LAB — TSH: TSH: 1.92 u[IU]/mL (ref 0.450–4.500)

## 2015-08-03 ENCOUNTER — Telehealth: Payer: Self-pay | Admitting: Adult Health

## 2015-08-03 NOTE — Telephone Encounter (Signed)
Mailbox full

## 2015-08-04 ENCOUNTER — Telehealth: Payer: Self-pay | Admitting: Adult Health

## 2015-08-05 MED ORDER — LEVONORGEST-ETH ESTRAD 91-DAY 0.15-0.03 &0.01 MG PO TABS: 1.0000 | ORAL_TABLET | Freq: Every day | ORAL | Status: DC

## 2015-08-05 NOTE — Telephone Encounter (Signed)
She saw psychiatrist today and thinks she needs to be on OCs to help with moods,wants 3 month pack ,will rx seasonique, pt aware of labs and US,increase exercise and change diet to work on getting lipids better

## 2016-07-05 ENCOUNTER — Other Ambulatory Visit (HOSPITAL_COMMUNITY): Payer: Self-pay | Admitting: Internal Medicine

## 2016-07-05 DIAGNOSIS — R109 Unspecified abdominal pain: Secondary | ICD-10-CM

## 2017-09-03 DIAGNOSIS — F32A Depression, unspecified: Secondary | ICD-10-CM | POA: Insufficient documentation

## 2017-09-03 DIAGNOSIS — F329 Major depressive disorder, single episode, unspecified: Secondary | ICD-10-CM | POA: Insufficient documentation

## 2018-02-11 ENCOUNTER — Ambulatory Visit (INDEPENDENT_AMBULATORY_CARE_PROVIDER_SITE_OTHER): Payer: Managed Care, Other (non HMO) | Admitting: Internal Medicine

## 2018-02-11 ENCOUNTER — Encounter (INDEPENDENT_AMBULATORY_CARE_PROVIDER_SITE_OTHER): Payer: Self-pay | Admitting: Internal Medicine

## 2018-04-10 ENCOUNTER — Ambulatory Visit (INDEPENDENT_AMBULATORY_CARE_PROVIDER_SITE_OTHER): Payer: 59

## 2018-04-10 ENCOUNTER — Encounter (INDEPENDENT_AMBULATORY_CARE_PROVIDER_SITE_OTHER): Payer: Self-pay | Admitting: Orthopaedic Surgery

## 2018-04-10 ENCOUNTER — Ambulatory Visit (INDEPENDENT_AMBULATORY_CARE_PROVIDER_SITE_OTHER): Payer: 59 | Admitting: Orthopaedic Surgery

## 2018-04-10 DIAGNOSIS — M25551 Pain in right hip: Secondary | ICD-10-CM

## 2018-04-10 MED ORDER — METHYLPREDNISOLONE ACETATE 40 MG/ML IJ SUSP
40.0000 mg | INTRAMUSCULAR | Status: AC | PRN
  Administered 2018-04-10: 40 mg via INTRA_ARTICULAR

## 2018-04-10 MED ORDER — LIDOCAINE HCL 1 % IJ SOLN
3.0000 mL | INTRAMUSCULAR | Status: AC | PRN
Start: 2018-04-10 — End: 2018-04-10
  Administered 2018-04-10: 3 mL

## 2018-04-10 MED ORDER — BUPIVACAINE HCL 0.5 % IJ SOLN
3.0000 mL | INTRAMUSCULAR | Status: AC | PRN
  Administered 2018-04-10: 3 mL via INTRA_ARTICULAR

## 2018-04-10 NOTE — Progress Notes (Signed)
Office Visit Note   Patient: Annette Mitchell           Date of Birth: 12/13/1980           MRN: [id number] Visit Date: 04/10/2018              Requested by: Assunta Found, MD [address] [place], [place] [address] PCP: Assunta Found, MD   Assessment & Plan: Visit Diagnoses:  1. Pain in right hip     Plan: Impression is right hip trochanteric bursitis.  Injection was performed today under sterile conditions.  Patient tolerated well.  Home exercises were provided for IT band stretching.  Follow-up as needed.  Follow-Up Instructions: Return if symptoms worsen or fail to improve.   Orders:  Orders Placed This Encounter  Procedures  . XR HIP UNILAT W OR W/O PELVIS 2-3 VIEWS RIGHT   No orders of the defined types were placed in this encounter.     Procedures: Large Joint Inj: R greater trochanter on 04/10/2018 9:21 PM Indications: pain Details: 22 G needle  Arthrogram: No  Medications: 3 mL lidocaine 1 %; 3 mL bupivacaine 0.5 %; 40 mg methylPREDNISolone acetate 40 MG/ML Patient was prepped and draped in the usual sterile fashion.       Clinical Data: No additional findings.   Subjective: Chief Complaint  Patient presents with  . Right Hip - Pain    Annette Mitchell is a 37 year old female comes in with right hip pain.She is 20 years status post intramedullary fixation of a femur fracture.  She has occasional bursitis that she gets cortisone injections for.  She is interested in having another one today.  Denies any groin pain.   Review of Systems  Constitutional: Negative.   HENT: Negative.   Eyes: Negative.   Respiratory: Negative.   Cardiovascular: Negative.   Endocrine: Negative.   Musculoskeletal: Negative.   Neurological: Negative.   Hematological: Negative.   Psychiatric/Behavioral: Negative.   All other systems reviewed and are negative.    Objective: Vital Signs: There were no vitals taken for this visit.  Physical Exam  Constitutional: She  is oriented to person, place, and time. She appears well-developed and well-nourished.  HENT:  Head: Normocephalic and atraumatic.  Eyes: EOM are normal.  Neck: Neck supple.  Pulmonary/Chest: Effort normal.  Abdominal: Soft.  Neurological: She is alert and oriented to person, place, and time.  Skin: Skin is warm. Capillary refill takes less than 2 seconds.  Psychiatric: She has a normal mood and affect. Her behavior is normal. Judgment and thought content normal.  Nursing note and vitals reviewed.   Ortho Exam Right hip exam shows tenderness over the greater trochanter and bursa.  No groin pain.  Normal range of motion of the hip. Specialty Comments:  No specialty comments available.  Imaging: Xr Hip Unilat W Or W/o Pelvis 2-3 Views Right  Result Date: 04/10/2018 Retained femoral nail.  No acute abnormalities    PMFS History: Patient Active Problem List   Diagnosis Date Noted  . Vaginal pain 06/30/2015  . Dyspareunia 06/30/2015  . BV (bacterial vaginosis) 06/30/2015   Past Medical History:  Diagnosis Date  . Asthma   . BV (bacterial vaginosis) 06/30/2015  . H. pylori infection   . Headache   . Hypertension   . Vaginal pain 06/30/2015    Family History  Problem Relation Age of Onset  . Heart attack Father   . Mental illness Sister   . Cancer Maternal Grandmother   .  Cancer Maternal Grandfather   . Stroke Paternal Grandmother     Past Surgical History:  Procedure Laterality Date  . ANKLE FRACTURE SURGERY     Left ankle  . BIOPSY N/A 06/01/2015   Procedure: BIOPSY;  Surgeon: Franky Macho, MD;  Location[facility];  Service: Gastroenterology;  Laterality: N/A;  . csections     x3  . ESOPHAGOGASTRODUODENOSCOPY N/A 06/01/2015   Procedure: ESOPHAGOGASTRODUODENOSCOPY (EGD);  Surgeon: Franky Macho, MD;  Location: [facility];  Service: Gastroenterology;  Laterality: N/A;  . Leg surgery- Femur-metal implanted    . TONSILLECTOMY    . TUBAL LIGATION    . uretheral  surgery     Social History   Occupational History  . Not on file  Tobacco Use  . Smoking status: Never Smoker  . Smokeless tobacco: Never Used  Substance and Sexual Activity  . Alcohol use: No  . Drug use: No  . Sexual activity: Yes    Birth control/protection: Surgical    Comment: tubes were removed

## 2018-08-03 ENCOUNTER — Encounter: Payer: Self-pay | Admitting: *Deleted

## 2018-08-03 DIAGNOSIS — F429 Obsessive-compulsive disorder, unspecified: Secondary | ICD-10-CM | POA: Insufficient documentation

## 2018-08-03 DIAGNOSIS — F411 Generalized anxiety disorder: Secondary | ICD-10-CM

## 2018-08-03 DIAGNOSIS — F988 Other specified behavioral and emotional disorders with onset usually occurring in childhood and adolescence: Secondary | ICD-10-CM

## 2018-08-27 ENCOUNTER — Encounter: Payer: Self-pay | Admitting: Physician Assistant

## 2018-08-27 ENCOUNTER — Ambulatory Visit (INDEPENDENT_AMBULATORY_CARE_PROVIDER_SITE_OTHER): Payer: 59 | Admitting: Physician Assistant

## 2018-08-27 VITALS — BP 149/112 | HR 105

## 2018-08-27 DIAGNOSIS — F900 Attention-deficit hyperactivity disorder, predominantly inattentive type: Secondary | ICD-10-CM

## 2018-08-27 DIAGNOSIS — F319 Bipolar disorder, unspecified: Secondary | ICD-10-CM | POA: Diagnosis not present

## 2018-08-27 DIAGNOSIS — F429 Obsessive-compulsive disorder, unspecified: Secondary | ICD-10-CM | POA: Diagnosis not present

## 2018-08-27 MED ORDER — AMPHETAMINE-DEXTROAMPHETAMINE 20 MG PO TABS: ORAL_TABLET | ORAL | 0 refills | Status: DC

## 2018-08-27 NOTE — Progress Notes (Addendum)
[facility]  Patient ID: Annette Mitchell,  MRN: [id number]  PCP: Assunta Found, MD  Date of Evaluation: 08/27/2018 Time spent:15 minutes   HISTORY/CURRENT STATUS: HPI patient presents for routine medication checkup.  Is been 3 months since she was last seen. "I am doing better than I have been a long time.  I am enjoying things, my energy level and motivation are really good.  Every once in a while I have to take a nap during the day but overall I am fine.  I am doing things with my family and friends.  I do not cry easily.  I feel like I am sleeping good.  I went to [place] to visit family over the summer and forgot the Pristiq when I came back.  I was out of it for about 2 weeks when I was able to get more and I have been feeling so good I just never restarted it.  I rarely have to take the Ativan and it is usually because I am not able to sleep.  Is not, no.  I still take the Adderall which helps a lot.  Helps me get the kids going in the morning and do her homework, do housework just normal life type stuff." We had increased the Seroquel at the last visit.  She states that seems to be doing well.  She denies any increased energy with decreased need for sleep.  No impulsivity or risky behavior.  No increased libido or spending, no grandiosity or other manic symptoms.   Individual Medical History/ Review of Systems: Changes? :No  Allergies: Latex and Betadine [povidone iodine]  Current Medications:  Current Outpatient Medications:  .  QUEtiapine (SEROQUEL) 400 MG tablet, Take 600 mg by mouth at bedtime., Disp: , Rfl:  .  amphetamine-dextroamphetamine (ADDERALL) 20 MG tablet, 1 po q am and 1/2 po at lunch, Disp: 45 tablet, Rfl: 0 .  amphetamine-dextroamphetamine (ADDERALL) 20 MG tablet, 1 po q a.m. And 1/2 po at lunch, Disp: 45 tablet, Rfl: 0 .  amphetamine-dextroamphetamine (ADDERALL) 20 MG tablet, 1 q am and 1/2 po at lunch, Disp: 45 tablet, Rfl: 0 .  hydrochlorothiazide  (HYDRODIURIL) 25 MG tablet, Take 25 mg by mouth daily., Disp: , Rfl:  .  PROAIR HFA 108 (90 BASE) MCG/ACT inhaler, INHALE 2 PUFFS EVERY 4 TO 6 HOURS IF NEEDED, Disp: , Rfl: 3 Medication Side Effects: None  Family Medical/ Social History: Changes? No  MENTAL HEALTH EXAM:  Blood pressure (!) 149/112, pulse (!) 105.There is no height or weight on file to calculate BMI.  General Appearance: Well Groomed obese  Eye Contact:  Good  Speech:  Clear and Coherent  Volume:  Normal  Mood:  Euthymic  Affect:  Appropriate  Thought Process:  Goal Directed  Orientation:  Full (Time, Place, and Person)  Thought Content: Logical   Suicidal Thoughts:  No  Homicidal Thoughts:  No  Memory:  Immediate  Judgement:  Good  Insight:  Good  Psychomotor Activity:  Normal  Concentration:  Concentration: Good  Recall:  Good  Fund of Knowledge: Good  Language: Good  Akathisia:  No  AIMS (if indicated): not done  Assets:  Communication Skills  ADL's:  Intact  Cognition: WNL  Prognosis:  Good    DIAGNOSES:    ICD-10-CM   1. Bipolar I disorder (HCC) F31.9   2. Obsessive-compulsive disorder, unspecified type F42.9   3. Attention deficit hyperactivity disorder (ADHD), predominantly inattentive type F90.0  RECOMMENDATIONS: Since she is doing so well with the current medications, we will not make any changes. Continue Seroquel 400 mg, she takes 1-1/2 nightly.  Continue Adderall 20 mg in the morning and half at lunchtime. Rarely takes Ativan 0.5 mg. Due to distance and finances, we will have her make an appointment in 3 months but if she is doing well we can postpone the visit until 6 months.  Okay to refill all medications for a total of 6 months. Addendum: We discussed her blood pressure.  She states when it is checked at home or manually at her PCPs office that it is normal.  At this point, it is fine to continue observation.  She will check it at home and follow-up with PCP if it remains 140/90 or  above on a consistent basis.  Melony Overly, PA-C

## 2018-10-14 ENCOUNTER — Other Ambulatory Visit: Payer: Self-pay

## 2018-10-14 MED ORDER — QUETIAPINE FUMARATE 400 MG PO TABS: 600.0000 mg | ORAL_TABLET | Freq: Every day | ORAL | 0 refills | Status: DC

## 2018-10-21 ENCOUNTER — Other Ambulatory Visit (HOSPITAL_COMMUNITY): Payer: Self-pay | Admitting: Chiropractic Medicine

## 2018-10-21 DIAGNOSIS — M545 Low back pain, unspecified: Secondary | ICD-10-CM

## 2018-10-21 DIAGNOSIS — M79605 Pain in left leg: Secondary | ICD-10-CM

## 2018-10-25 ENCOUNTER — Ambulatory Visit (HOSPITAL_COMMUNITY): Admission: RE | Admit: 2018-10-25 | Payer: 59 | Source: Ambulatory Visit

## 2018-11-01 ENCOUNTER — Ambulatory Visit (HOSPITAL_COMMUNITY)
Admission: RE | Admit: 2018-11-01 | Discharge: 2018-11-01 | Disposition: A | Discharge status: Home / Self Care | Payer: 59 | Source: Ambulatory Visit | Attending: Chiropractic Medicine | Admitting: Chiropractic Medicine

## 2018-11-01 DIAGNOSIS — M47816 Spondylosis without myelopathy or radiculopathy, lumbar region: Secondary | ICD-10-CM | POA: Insufficient documentation

## 2018-11-01 DIAGNOSIS — M9903 Segmental and somatic dysfunction of lumbar region: Secondary | ICD-10-CM | POA: Insufficient documentation

## 2018-11-01 DIAGNOSIS — M545 Low back pain: Secondary | ICD-10-CM | POA: Diagnosis not present

## 2018-11-01 DIAGNOSIS — M79605 Pain in left leg: Secondary | ICD-10-CM

## 2018-11-27 ENCOUNTER — Ambulatory Visit: Payer: 59 | Admitting: Physician Assistant

## 2019-01-06 ENCOUNTER — Other Ambulatory Visit: Payer: Self-pay | Admitting: Physician Assistant

## 2019-01-06 ENCOUNTER — Ambulatory Visit: Payer: 59 | Admitting: Physician Assistant

## 2019-01-07 NOTE — Telephone Encounter (Signed)
Only give 45 pills.  Call her and tell her she has to be seen for more.

## 2019-01-07 NOTE — Telephone Encounter (Signed)
Looks like she has canceled appts. Was scheduled yesterday and canceled also. Last office visit 08/2018

## 2019-01-20 ENCOUNTER — Encounter: Payer: Self-pay | Admitting: Physician Assistant

## 2019-01-20 ENCOUNTER — Ambulatory Visit (INDEPENDENT_AMBULATORY_CARE_PROVIDER_SITE_OTHER): Payer: 59 | Admitting: Physician Assistant

## 2019-01-20 VITALS — BP 139/103 | HR 88

## 2019-01-20 DIAGNOSIS — F429 Obsessive-compulsive disorder, unspecified: Secondary | ICD-10-CM | POA: Diagnosis not present

## 2019-01-20 DIAGNOSIS — F319 Bipolar disorder, unspecified: Secondary | ICD-10-CM | POA: Diagnosis not present

## 2019-01-20 DIAGNOSIS — F900 Attention-deficit hyperactivity disorder, predominantly inattentive type: Secondary | ICD-10-CM | POA: Diagnosis not present

## 2019-01-20 MED ORDER — QUETIAPINE FUMARATE 400 MG PO TABS: 600.0000 mg | ORAL_TABLET | Freq: Every day | ORAL | 5 refills | Status: DC

## 2019-01-20 MED ORDER — AMPHETAMINE-DEXTROAMPHETAMINE 30 MG PO TABS: ORAL_TABLET | ORAL | 0 refills | Status: DC

## 2019-01-20 NOTE — Progress Notes (Signed)
[facility]  Patient ID: Annette Mitchell,  MRN: [id number]  PCP: Assunta Found, MD  Date of Evaluation: 01/20/19 Time spent:15 minutes  Chief Complaint:  Chief Complaint    Follow-up      HISTORY/CURRENT STATUS: HPI Here for routine med check. Accomp by son.  For past 3-4 weeks, she's been really tired and not wanting to do anything. Feels that the Adderall isn't working as well as it did.  Has a hard time getting started.  Not depressed, but feels down b/c she can't get her work done.  She does enjoy things.  She does not cry easily.  She is sleeping well.  Patient denies increased energy with decreased need for sleep, no increased talkativeness, no racing thoughts, no impulsivity or risky behaviors, no increased spending, no increased libido, no grandiosity.  Denies dizziness, syncope, seizures, numbness, tingling, tremor, tics, unsteady gait, slurred speech, confusion.   Denies muscle or joint pain, stiffness, or dystonia.  Denies polyuria, polydipsia, polyphagia.  No extreme weight loss or gain.  Individual Medical History/ Review of Systems: Changes? :No    Past medications for mental health diagnoses include: Cymbalta, Prozac, Zoloft, Celexa, Paxil, Lexapro, Wellbutrin, lithium, Seroquel, Latuda, Lamictal, Vraylar caused migraines, Lunesta, Sonata, Restoril, Rexulti, Abilify,  Allergies: Latex and Betadine [povidone iodine]  Current Medications:  Current Outpatient Medications:  .  amphetamine-dextroamphetamine (ADDERALL) 20 MG tablet, 1 po q am and 1/2 po at lunch, Disp: 45 tablet, Rfl: 0 .  amphetamine-dextroamphetamine (ADDERALL) 20 MG tablet, 1 po q a.m. And 1/2 po at lunch, Disp: 45 tablet, Rfl: 0 .  amphetamine-dextroamphetamine (ADDERALL) 20 MG tablet, 1 q am and 1/2 po at lunch, Disp: 45 tablet, Rfl: 0 .  hydrochlorothiazide (HYDRODIURIL) 25 MG tablet, Take 25 mg by mouth daily., Disp: , Rfl:  .  levothyroxine (SYNTHROID, LEVOTHROID) 50 MCG tablet,  , Disp: , Rfl:  .  meclizine (ANTIVERT) 25 MG tablet, , Disp: , Rfl:  .  PROAIR HFA 108 (90 BASE) MCG/ACT inhaler, INHALE 2 PUFFS EVERY 4 TO 6 HOURS IF NEEDED, Disp: , Rfl: 3 .  QUEtiapine (SEROQUEL) 400 MG tablet, Take 1.5 tablets (600 mg total) by mouth at bedtime., Disp: 45 tablet, Rfl: 5 .  amphetamine-dextroamphetamine (ADDERALL) 30 MG tablet, 1 po q am, 1/2 at noon., Disp: 45 tablet, Rfl: 0 Medication Side Effects: none  Family Medical/ Social History: Changes? No  MENTAL HEALTH EXAM:  Blood pressure (!) 139/103, pulse 88.There is no height or weight on file to calculate BMI.  General Appearance: Casual, Well Groomed and Obese  Eye Contact:  Good  Speech:  Clear and Coherent  Volume:  Normal  Mood:  Euthymic  Affect:  Appropriate  Thought Process:  Goal Directed  Orientation:  Full (Time, Place, and Person)  Thought Content: Logical   Suicidal Thoughts:  No  Homicidal Thoughts:  No  Memory:  WNL  Judgement:  Good  Insight:  Good  Psychomotor Activity:  Normal  Concentration:  Concentration: Fair and Attention Span: Fair  Recall:  Good  Fund of Knowledge: Good  Language: Good  Assets:  Desire for Improvement  ADL's:  Intact  Cognition: WNL  Prognosis:  Good    DIAGNOSES:    ICD-10-CM   1. Bipolar I disorder (HCC) F31.9   2. Attention deficit hyperactivity disorder (ADHD), predominantly inattentive type F90.0   3. Obsessive-compulsive disorder, unspecified type F42.9     Receiving Psychotherapy: No    RECOMMENDATIONS: Increase Adderall to 30 mg p.o.  1 every morning and one half at noon.  If this works well, I will send in  prescriptions for 3 months total.  She should call in approximately 3 weeks to let me know how it is working.  If it does not work then we will change the dose accordingly.  PDMP was reviewed. Continue Seroquel 600 mg nightly. Order labs at the next visit if she has not had them drawn elsewhere. Return in 6 months or sooner as  needed.   Melony Overly, PA-C   This record has been created using [employer].  Chart creation errors have been sought, but may not always have been located and corrected. Such creation errors do not reflect on the standard of medical care.

## 2019-03-10 ENCOUNTER — Other Ambulatory Visit: Payer: Self-pay | Admitting: Physician Assistant

## 2019-04-04 ENCOUNTER — Other Ambulatory Visit: Payer: Self-pay | Admitting: Physician Assistant

## 2019-04-27 ENCOUNTER — Other Ambulatory Visit: Payer: Self-pay | Admitting: Physician Assistant

## 2019-06-20 ENCOUNTER — Other Ambulatory Visit: Payer: Self-pay

## 2019-06-20 ENCOUNTER — Telehealth: Payer: Self-pay | Admitting: Physician Assistant

## 2019-06-20 MED ORDER — AMPHETAMINE-DEXTROAMPHETAMINE 30 MG PO TABS: ORAL_TABLET | ORAL | 0 refills | Status: DC

## 2019-06-20 NOTE — Telephone Encounter (Signed)
Pended for approval.

## 2019-06-20 NOTE — Telephone Encounter (Signed)
Pt called to request refill on Adderall @ [facility] Next appt 9/9

## 2019-06-20 NOTE — Telephone Encounter (Signed)
Pt called back to ask for a change in pharmacy for Adderall.  [facility] to [facility]  [address], in [place], [place].

## 2019-07-23 ENCOUNTER — Encounter: Payer: Self-pay | Admitting: Physician Assistant

## 2019-07-23 ENCOUNTER — Other Ambulatory Visit: Payer: Self-pay

## 2019-07-23 ENCOUNTER — Ambulatory Visit (INDEPENDENT_AMBULATORY_CARE_PROVIDER_SITE_OTHER): Payer: 59 | Admitting: Physician Assistant

## 2019-07-23 DIAGNOSIS — F900 Attention-deficit hyperactivity disorder, predominantly inattentive type: Secondary | ICD-10-CM

## 2019-07-23 DIAGNOSIS — F319 Bipolar disorder, unspecified: Secondary | ICD-10-CM

## 2019-07-23 MED ORDER — AMPHETAMINE-DEXTROAMPHETAMINE 30 MG PO TABS: ORAL_TABLET | ORAL | 0 refills | Status: DC

## 2019-07-23 MED ORDER — QUETIAPINE FUMARATE 400 MG PO TABS: 400.0000 mg | ORAL_TABLET | Freq: Every day | ORAL | 1 refills | Status: DC

## 2019-07-23 NOTE — Progress Notes (Addendum)
[facility]  Patient ID: Annette Annette Mitchell,  MRN: [id number]  PCP: [name], [name], MD  Date of Evaluation: 07/23/2019 Time spent:15 minutes  Chief Complaint:  Chief Complaint    ADD; Depression; Follow-up     Virtual Visit via Telephone Note  I connected with patient by a video enabled telemedicine application or telephone, with their informed consent, and verified patient privacy and that I am speaking with the correct person using two identifiers.  I am private, in my home and the patient is home.  I discussed the limitations, risks, security and privacy concerns of performing an evaluation and management service by telephone and the availability of in person appointments. I also discussed with the patient that there may be a patient responsible charge related to this service. The patient expressed understanding and agreed to proceed.   I discussed the assessment and treatment plan with the patient. The patient was provided an opportunity to ask questions and all were answered. The patient agreed with the plan and demonstrated an understanding of the instructions.   The patient was advised to call back or seek an in-person evaluation if the symptoms worsen or if the condition fails to improve as anticipated.  I provided 15 minutes of non-face-to-face time during this encounter.  HISTORY/CURRENT STATUS: HPI For routine 6 month med check.  States she's doing really well.  The only problem is feeling drowsy. "I would sleep 12 hours if I could."  Feels that the higher dose of Seroquel is causing it.  Wonders if we could decrease it.   Her mood is great.  "I am not depressed or anything.  I am able to enjoy things although we do not really go anywhere.  My kids are doing online school at home.  I do not want us to get out and be exposed to COVID."  Energy and motivation are good, after she wakes up and gets going.  The Adderall does help.  Her focus is good and she feels that  the Adderall is effective.  She is able to complete tasks without a lot of trouble.  Denies suicidal or homicidal thoughts.  Patient denies increased energy with decreased need for sleep, no increased talkativeness, no racing thoughts, no impulsivity or risky behaviors, no increased spending, no increased libido, no grandiosity.  Denies dizziness, syncope, seizures, numbness, tingling, tremor, tics, unsteady gait, slurred speech, confusion. Denies muscle or joint pain, stiffness, or dystonia.  Individual Medical History/ Review of Systems: Changes? :No    Past medications for mental health diagnoses include: Cymbalta, Prozac, Zoloft, Celexa, Paxil, Lexapro, Wellbutrin, lithium, Seroquel, Latuda, Lamictal, Vraylar caused migraines, Lunesta, Sonata, Restoril, Rexulti, Abilify,  Allergies: Latex and Betadine [povidone iodine]  Current Medications:  Current Outpatient Medications:  .  [START ON 08/21/2019] amphetamine-dextroamphetamine (ADDERALL) 30 MG tablet, 1 po q am, 1/2 at noon., Disp: 45 tablet, Rfl: 0 .  hydrochlorothiazide (HYDRODIURIL) 25 MG tablet, Take 25 mg by mouth daily., Disp: , Rfl:  .  levothyroxine (SYNTHROID, LEVOTHROID) 50 MCG tablet, , Disp: , Rfl:  .  meclizine (ANTIVERT) 25 MG tablet, , Disp: , Rfl:  .  PROAIR HFA 108 (90 BASE) MCG/ACT inhaler, INHALE 2 PUFFS EVERY 4 TO 6 HOURS IF NEEDED, Disp: , Rfl: 3 .  QUEtiapine (SEROQUEL) 400 MG tablet, Take 1 tablet (400 mg total) by mouth at bedtime., Disp: 90 tablet, Rfl: 1 .  amphetamine-dextroamphetamine (ADDERALL) 30 MG tablet, 1 po q am, 1/2 po at lunch, Disp: 45 tablet, Rfl:  0 .  [START ON 09/20/2019] amphetamine-dextroamphetamine (ADDERALL) 30 MG tablet, 1 qam, 1/2 at lunch, Disp: 45 tablet, Rfl: 0 Medication Side Effects: none  Family Medical/ Social History: Changes?  Kids are doing online school.  MENTAL HEALTH EXAM:  There were no vitals taken for this visit.There is no height or weight on file to calculate BMI.   General Appearance: unable to assess  Eye Contact:  unable to assess  Speech:  Clear and Coherent  Volume:  Normal  Mood:  Euthymic  Affect:  unable to assess  Thought Process:  Goal Directed  Orientation:  Full (Time, Place, and Person)  Thought Content: Logical   Suicidal Thoughts:  No  Homicidal Thoughts:  No  Memory:  WNL  Judgement:  Good  Insight:  Good  Psychomotor Activity:  unable to assess  Concentration:  Concentration: Good and Attention Span: Good  Recall:  Good  Fund of Knowledge: Good  Language: Good  Assets:  Desire for Improvement  ADL's:  Intact  Cognition: WNL  Prognosis:  Good    DIAGNOSES:    ICD-10-CM   1. Bipolar I disorder ([place])  F31.9   2. Attention deficit hyperactivity disorder (ADHD), predominantly inattentive type  F90.0     Receiving Psychotherapy: No    RECOMMENDATIONS:  PDMP was reviewed. Decrease Seroquel to 400 mg p.o. nightly. Continue Adderall 30 mg, 1 p.o. every morning and 1/2 p.o. around lunchtime. She will have her PCP order glucose and cholesterol levels sometime within the next month or so when she goes for her physical. Return in 6 months.  Donnal Moat, PA-C   This record has been created using [employer].  Chart creation errors have been sought, but may not always have been located and corrected. Such creation errors do not reflect on the standard of medical care.

## 2019-08-01 ENCOUNTER — Ambulatory Visit: Payer: 59 | Admitting: Orthopaedic Surgery

## 2019-08-05 ENCOUNTER — Other Ambulatory Visit: Payer: Self-pay

## 2019-08-05 ENCOUNTER — Encounter: Payer: Self-pay | Admitting: Orthopaedic Surgery

## 2019-08-05 ENCOUNTER — Ambulatory Visit (INDEPENDENT_AMBULATORY_CARE_PROVIDER_SITE_OTHER): Payer: 59 | Admitting: Orthopaedic Surgery

## 2019-08-05 ENCOUNTER — Ambulatory Visit (INDEPENDENT_AMBULATORY_CARE_PROVIDER_SITE_OTHER): Payer: 59

## 2019-08-05 ENCOUNTER — Ambulatory Visit: Payer: Self-pay

## 2019-08-05 VITALS — Ht 61.0 in | Wt 280.0 lb

## 2019-08-05 DIAGNOSIS — M25511 Pain in right shoulder: Secondary | ICD-10-CM

## 2019-08-05 DIAGNOSIS — M25512 Pain in left shoulder: Secondary | ICD-10-CM

## 2019-08-05 DIAGNOSIS — M25562 Pain in left knee: Secondary | ICD-10-CM | POA: Diagnosis not present

## 2019-08-05 MED ORDER — METHYLPREDNISOLONE ACETATE 40 MG/ML IJ SUSP
40.0000 mg | INTRAMUSCULAR | Status: AC | PRN
  Administered 2019-08-05: 40 mg via INTRA_ARTICULAR

## 2019-08-05 MED ORDER — LIDOCAINE HCL 1 % IJ SOLN
2.0000 mL | INTRAMUSCULAR | Status: AC | PRN
  Administered 2019-08-05: 2 mL

## 2019-08-05 MED ORDER — BUPIVACAINE HCL 0.25 % IJ SOLN
2.0000 mL | INTRAMUSCULAR | Status: AC | PRN
  Administered 2019-08-05: 2 mL via INTRA_ARTICULAR

## 2019-08-05 MED ORDER — LIDOCAINE HCL 2 % IJ SOLN
2.0000 mL | INTRAMUSCULAR | Status: AC | PRN
  Administered 2019-08-05: 2 mL

## 2019-08-05 NOTE — Progress Notes (Signed)
Office Visit Note   Patient: Annette Mitchell           Date of Birth: 06-28-1981           MRN: [id number] Visit Date: 08/05/2019              Requested by: Assunta Found, MD [address] [place],  [place] [address] PCP: Assunta Found, MD   Assessment & Plan: Visit Diagnoses:  1. Left knee pain, unspecified chronicity   2. Bilateral shoulder pain, unspecified chronicity     Plan: Impression is left knee osteoarthritis and patellofemoral syndrome and bilateral shoulder subacromial bursitis.  We will inject the left knee and left shoulder with cortisone today.  Have also discussed the importance of quadriceps strengthening to the left knee.  I provided her with a handout for this as she would rather not attend formal therapy at this point.  She will follow-up with [place] for right shoulder injection in the near future if she has continued pain.  Call with concerns or questions.  Follow-Up Instructions: No follow-ups on file.   Orders:  Orders Placed This Encounter  Procedures  . Large Joint Inj: L knee  . Large Joint Inj: L subacromial bursa  . XR Knee 1-2 Views Left  . XR Shoulder Right  . XR Shoulder Left   No orders of the defined types were placed in this encounter.     Procedures: Large Joint Inj: L knee on 08/05/2019 4:38 PM Indications: pain Details: 22 G needle, anterolateral approach Medications: 2 mL bupivacaine 0.25 %; 2 mL lidocaine 1 %; 40 mg methylPREDNISolone acetate 40 MG/ML  Large Joint Inj: L subacromial bursa on 08/05/2019 4:38 PM Indications: pain Details: 22 G needle Medications: 2 mL bupivacaine 0.25 %; 2 mL lidocaine 2 %; 40 mg methylPREDNISolone acetate 40 MG/ML Outcome: tolerated well, no immediate complications Patient was prepped and draped in the usual sterile fashion.       Clinical Data: No additional findings.   Subjective: Chief Complaint  Patient presents with  . Left Knee - Pain  . Right Shoulder - Pain  . Left Shoulder -  Pain    HPI patient is a pleasant 38 year old female who presents our clinic today with left knee and bilateral shoulder pain both equally as bad.  In regards to her left knee, she has had pain here for the past 2 to 3 months.  No known injury or change in activity.  Pain she has is to the entire aspect of the left knee worse when putting her knee into full extension.  She has been taking ibuprofen as well as using ice without significant relief of symptoms.  She denies any radicular symptoms.  No previous cortisone injection or surgical intervention.  In regards to her shoulders, both are equally as bad.  Pain here for the past 3 to 4 months without any known injury.  The pain she has is to the anterior aspect of both shoulders and radiates into the deltoid muscle.  Pain is worse with external and internal rotation as well as abduction of the shoulder.  She does have increased pain when she is lifting anything heavy as well.  Ibuprofen does not seem to relieve her symptoms.  No radicular symptoms.  She has had a previous history of right shoulder cortisone injection several years ago which did seem to improve her symptoms until recently.  Review of Systems as detailed in HPI.  All others reviewed and are negative.  Objective: Vital Signs: Ht 5\' 1"  (1.549 m)   Wt 280 lb (127 kg)   BMI 52.91 kg/m   Physical Exam well-developed and well-nourished female no acute distress.  Alert and oriented x3.  Ortho Exam examination of her left knee reveals range of motion from 0 to 95 degrees.  Medial joint line tenderness.  Stable to valgus varus stress.  Mild patellofemoral crepitus.  She is neurovascular intact distally.  Examination of both shoulders reveals full active range of motion in all planes.  She can internally rotate to L5 both sides.  Minimally positive empty can and cross body.  Negative speeds and O'Brien's.  Full strength.  She is neurovascular intact distally.  Specialty Comments:  No  specialty comments available.  Imaging: Xr Knee 1-2 Views Left  Result Date: 08/05/2019 Moderate joint space narrowing all 3 compartments  Xr Shoulder Left  Result Date: 08/05/2019 No acute or structural abnormalities  Xr Shoulder Right  Result Date: 08/05/2019 No acute or structural abnormalities    PMFS History: Patient Active Problem List   Diagnosis Date Noted  . OCD (obsessive compulsive disorder) 08/03/2018  . Anxiety state 08/03/2018  . Attention deficit disorder (ADD) 08/03/2018  . Vaginal pain 06/30/2015  . Dyspareunia 06/30/2015  . BV (bacterial vaginosis) 06/30/2015   Past Medical History:  Diagnosis Date  . Asthma   . BV (bacterial vaginosis) 06/30/2015  . H. pylori infection   . Headache   . Hypertension   . Vaginal pain 06/30/2015    Family History  Problem Relation Age of Onset  . Heart attack Father   . Mental illness Sister   . Cancer Maternal Grandmother   . Cancer Maternal Grandfather   . Stroke Paternal Grandmother     Past Surgical History:  Procedure Laterality Date  . ANKLE FRACTURE SURGERY     Left ankle  . BIOPSY N/A 06/01/2015   Procedure: BIOPSY;  Surgeon: Aviva Signs, MD;  Location: [facility];  Service: Gastroenterology;  Laterality: N/A;  . csections     x3  . ESOPHAGOGASTRODUODENOSCOPY N/A 06/01/2015   Procedure: ESOPHAGOGASTRODUODENOSCOPY (EGD);  Surgeon: Aviva Signs, MD;  Location: [facility];  Service: Gastroenterology;  Laterality: N/A;  . Leg surgery- Femur-metal implanted    . TONSILLECTOMY    . TUBAL LIGATION    . uretheral surgery     Social History   Occupational History  . Not on file  Tobacco Use  . Smoking status: Never Smoker  . Smokeless tobacco: Never Used  Substance and Sexual Activity  . Alcohol use: No  . Drug use: No  . Sexual activity: Yes    Birth control/protection: Surgical    Comment: tubes were removed

## 2019-09-18 ENCOUNTER — Telehealth: Payer: Self-pay | Admitting: Physician Assistant

## 2019-09-18 NOTE — Telephone Encounter (Signed)
Annette Mitchell called and left message that her medications were lowered not long ago.  She thinks they need to be increased again. Or something needs to be added.  Please call to discuss.  NO appt scheduled

## 2019-09-19 NOTE — Telephone Encounter (Signed)
Pt. Made aware and will call to make an appt.

## 2019-09-19 NOTE — Telephone Encounter (Signed)
Not sleeping well. Has affected her mood and making her feel down and depressed. Is still taking the Seroquel at 400mg 

## 2019-09-19 NOTE — Telephone Encounter (Signed)
Increase Seroquel back to 300 mg, 2 qhs.  (Or 400 mg 1.5 pills)  Needs appt w/in next 6 weeks.

## 2019-09-19 NOTE — Telephone Encounter (Signed)
Left voicemail to call back with symptoms 

## 2019-10-31 ENCOUNTER — Telehealth: Payer: Self-pay | Admitting: Physician Assistant

## 2019-10-31 NOTE — Telephone Encounter (Signed)
Yes, she can go out to [place].  That will be 6 months from [place].  When she needs Rfs, have her pharmacy contact us

## 2019-10-31 NOTE — Telephone Encounter (Signed)
Patient called and said that she is doing well on her medications and wants to know if she can push out her next appt 3- 6 months. Since you changed her medicines.Please call her at [phone]

## 2019-11-26 ENCOUNTER — Other Ambulatory Visit: Payer: Self-pay | Admitting: Physician Assistant

## 2020-01-12 ENCOUNTER — Encounter: Payer: Self-pay | Admitting: Physician Assistant

## 2020-01-12 ENCOUNTER — Ambulatory Visit (INDEPENDENT_AMBULATORY_CARE_PROVIDER_SITE_OTHER): Payer: 59 | Admitting: Physician Assistant

## 2020-01-12 DIAGNOSIS — F900 Attention-deficit hyperactivity disorder, predominantly inattentive type: Secondary | ICD-10-CM

## 2020-01-12 DIAGNOSIS — F3132 Bipolar disorder, current episode depressed, moderate: Secondary | ICD-10-CM | POA: Diagnosis not present

## 2020-01-12 DIAGNOSIS — F429 Obsessive-compulsive disorder, unspecified: Secondary | ICD-10-CM | POA: Diagnosis not present

## 2020-01-12 MED ORDER — ZALEPLON 10 MG PO CAPS: ORAL_CAPSULE | ORAL | 1 refills | Status: DC

## 2020-01-12 MED ORDER — FLUOXETINE HCL 20 MG PO CAPS: 20.0000 mg | ORAL_CAPSULE | Freq: Every day | ORAL | 1 refills | Status: DC

## 2020-01-12 NOTE — Progress Notes (Addendum)
[facility]  Patient ID: Annette Mitchell,  MRN: [id number]  PCP: Sharilyn Sites, MD  Date of Evaluation: 01/12/2020 Time spent:30 minutes  Chief Complaint:  Chief Complaint    Depression; Insomnia     Virtual Visit via Telephone Note  I connected with patient by a video enabled telemedicine application or telephone, with their informed consent, and verified patient privacy and that I am speaking with the correct person using two identifiers.  I am private, in my office and the patient is home.  I discussed the limitations, risks, security and privacy concerns of performing an evaluation and management service by telephone and the availability of in person appointments. I also discussed with the patient that there may be a patient responsible charge related to this service. The patient expressed understanding and agreed to proceed.   I discussed the assessment and treatment plan with the patient. The patient was provided an opportunity to ask questions and all were answered. The patient agreed with the plan and demonstrated an understanding of the instructions.   The patient was advised to call back or seek an in-person evaluation if the symptoms worsen or if the condition fails to improve as anticipated.  I provided 30 minutes of non-face-to-face time during this encounter.  HISTORY/CURRENT STATUS: HPI Not doing well.  For 2-3 weeks, has been more depressed.  Anhedonia, lacks energy and motivation, doesn't cry easily, appetite has decreased. "it's all I can do to get a shower.  But I don't skip. I have phobias about not being clean so I do those things." States everything gets on her nerves. Denies SI/HI.  In the past, she was on Prozac and lithium at different times which she responded to for a while.  States they quit working after that.  Would be willing to go back on the Prozac if appropriate.  Seroquel has been the third drug that has helped more than any others.  She  has been on the same dose for about 1-1/2 years.  We did increase it at some point but she became extremely lethargic and had a lot of drowsiness during the daytime.  Having trouble falling asleep and staying asleep. Wakes up a lot, and having more dreams.  Not nightmares.  Trazodone doesn't help.   Patient denies increased energy with decreased need for sleep, no increased talkativeness, no racing thoughts, no impulsivity or risky behaviors, no increased spending, no increased libido, no grandiosity.  Review of Systems  Constitutional: Positive for malaise/fatigue.  HENT: Negative.   Eyes: Negative.   Respiratory: Negative.   Cardiovascular: Negative.   Gastrointestinal: Negative.   Genitourinary: Negative.   Musculoskeletal: Negative.   Skin: Negative.   Neurological: Negative.   Endo/Heme/Allergies: Negative.   Psychiatric/Behavioral: Positive for depression. Negative for hallucinations, memory loss, substance abuse and suicidal ideas. The patient has insomnia. The patient is not nervous/anxious.    Individual Medical History/ Review of Systems: Changes? :No    Past medications for mental health diagnoses include: Cymbalta, Prozac, Zoloft, Celexa, Paxil, Lexapro, Pristiq caused paranoia,  Wellbutrin, lithium, Seroquel, Latuda, Lamictal, Vraylar caused migraines, Lunesta, Sonata, Restoril, Rexulti, Abilify, Ambien  Allergies: Latex, Betadine [povidone iodine], and Pristiq [desvenlafaxine]  Current Medications:  Current Outpatient Medications:  .  amphetamine-dextroamphetamine (ADDERALL) 30 MG tablet, 1 po q am, 1/2 at noon., Disp: 45 tablet, Rfl: 0 .  amphetamine-dextroamphetamine (ADDERALL) 30 MG tablet, 1 po q am, 1/2 po at lunch, Disp: 45 tablet, Rfl: 0 .  amphetamine-dextroamphetamine (ADDERALL) 30 MG  tablet, 1 qam, 1/2 at lunch, Disp: 45 tablet, Rfl: 0 .  levothyroxine (SYNTHROID, LEVOTHROID) 50 MCG tablet, , Disp: , Rfl:  .  PROAIR HFA 108 (90 BASE) MCG/ACT inhaler, INHALE 2  PUFFS EVERY 4 TO 6 HOURS IF NEEDED, Disp: , Rfl: 3 .  QUEtiapine (SEROQUEL) 400 MG tablet, TAKE 1 AND 1/2 TABLETS BY MOUTH AT BEDTIME, Disp: 135 tablet, Rfl: 1 .  hydrochlorothiazide (HYDRODIURIL) 25 MG tablet, Take 25 mg by mouth daily., Disp: , Rfl:  .  meclizine (ANTIVERT) 25 MG tablet, , Disp: , Rfl:  .  metoprolol tartrate (LOPRESSOR) 50 MG tablet, Take 50 mg by mouth 2 (two) times daily., Disp: , Rfl:  .  traZODone (DESYREL) 100 MG tablet, trazodone, Disp: , Rfl:  Medication Side Effects: none  Family Medical/ Social History: Changes? No  MENTAL HEALTH EXAM:  There were no vitals taken for this visit.There is no height or weight on file to calculate BMI.  General Appearance: Unable to assess  Eye Contact:  Unable to assess  Speech:  Clear and Coherent  Volume:  Normal  Mood:  Depressed  Affect:  Unable to assess  Thought Process:  Goal Directed and Descriptions of Associations: Intact  Orientation:  Full (Time, Place, and Person)  Thought Content: Logical   Suicidal Thoughts:  No  Homicidal Thoughts:  No  Memory:  WNL  Judgement:  Good  Insight:  Good  Psychomotor Activity:  Unable to assess  Concentration:  Concentration: Good  Recall:  Good  Fund of Knowledge: Good  Language: Good  Assets:  Desire for Improvement  ADL's:  Intact  Cognition: WNL  Prognosis:  Good    DIAGNOSES:    ICD-10-CM   1. Bipolar disorder with moderate depression ([facility])  F31.32   2. Obsessive-compulsive disorder, unspecified type  F42.9   3. Attention deficit hyperactivity disorder (ADHD), predominantly inattentive type  F90.0     Receiving Psychotherapy: No    RECOMMENDATIONS:  I spent 30 minutes with her. PDMP was reviewed. We discussed different medication options.  For now, I think it is best to add Prozac back in leaving all other meds the same.  Another option would be to change to a different antipsychotic, adding Lamictal or lithium back in would also be good choices.  Since we  know she responded to Prozac in the past, we agree this is the best treatment for now. Start Prozac 20 mg 1 p.o. every morning.  If she is not any better at all in about 2 weeks, call and we should go ahead and increase the dose.  If she is getting some better however I would like to leave the dose the same. Continue Adderall 30 mg, 1 p.o. every morning and one half p.o. daily around noon. Continue Seroquel 400 mg, 1.5 pills nightly. Start Sonata 10 mg nightly as needed and may repeat 1 for mid nocturnal awakening as long as she has 3 hours left to sleep, as needed. (For some reason I am unable to E prescribe controlled substances so I called in her prescriptions to [facility] [place] on file.) Return in about 4 weeks.   Melony Overly, PA-C

## 2020-01-19 ENCOUNTER — Telehealth: Payer: Self-pay | Admitting: Physician Assistant

## 2020-01-19 ENCOUNTER — Other Ambulatory Visit: Payer: Self-pay | Admitting: Physician Assistant

## 2020-01-19 NOTE — Telephone Encounter (Signed)
Yes, I did call it in.  Please call the pharmacy and see what has happened.  If they don't have it, ok to give the order again. Thanks

## 2020-01-19 NOTE — Telephone Encounter (Signed)
Annette Mitchell called for refill of her Sonata.  The Pharmacy said they don't have a refill for that medication only the prozac.  [facility]. In [place]

## 2020-01-20 NOTE — Telephone Encounter (Signed)
Yes, sounds good.

## 2020-01-21 NOTE — Telephone Encounter (Signed)
Prior authorization submitted for Zaleplon 10 mg #60 through [employer] ID# [id number] for quantity limit

## 2020-01-27 ENCOUNTER — Telehealth: Payer: Self-pay

## 2020-01-27 NOTE — Telephone Encounter (Signed)
Prior authorization for quantity submitted for ZALEPLON 10 MG CAPSULE #60 through [employer] ID# [id number] effective 01/21/2020-01/21/2023   Submitted through cover my meds

## 2020-02-18 ENCOUNTER — Other Ambulatory Visit (HOSPITAL_COMMUNITY): Payer: Self-pay | Admitting: Family Medicine

## 2020-02-18 ENCOUNTER — Other Ambulatory Visit: Payer: Self-pay | Admitting: Family Medicine

## 2020-02-18 DIAGNOSIS — R1011 Right upper quadrant pain: Secondary | ICD-10-CM

## 2020-04-20 ENCOUNTER — Encounter: Payer: Self-pay | Admitting: Orthopaedic Surgery

## 2020-04-20 ENCOUNTER — Ambulatory Visit: Payer: Self-pay

## 2020-04-20 ENCOUNTER — Ambulatory Visit (INDEPENDENT_AMBULATORY_CARE_PROVIDER_SITE_OTHER): Payer: No Typology Code available for payment source | Admitting: Orthopaedic Surgery

## 2020-04-20 VITALS — Ht 61.0 in | Wt 280.0 lb

## 2020-04-20 DIAGNOSIS — M25512 Pain in left shoulder: Secondary | ICD-10-CM | POA: Diagnosis not present

## 2020-04-20 DIAGNOSIS — G8929 Other chronic pain: Secondary | ICD-10-CM

## 2020-04-20 DIAGNOSIS — M25551 Pain in right hip: Secondary | ICD-10-CM | POA: Diagnosis not present

## 2020-04-20 MED ORDER — HYDROCODONE-ACETAMINOPHEN 5-325 MG PO TABS: 1.0000 | ORAL_TABLET | Freq: Four times a day (QID) | ORAL | 0 refills | Status: DC | PRN

## 2020-04-20 MED ORDER — METHYLPREDNISOLONE ACETATE 40 MG/ML IJ SUSP
40.0000 mg | INTRAMUSCULAR | Status: AC | PRN
  Administered 2020-04-20: 40 mg via INTRA_ARTICULAR

## 2020-04-20 MED ORDER — BUPIVACAINE HCL 0.5 % IJ SOLN
3.0000 mL | INTRAMUSCULAR | Status: AC | PRN
  Administered 2020-04-20: 3 mL via INTRA_ARTICULAR

## 2020-04-20 MED ORDER — LIDOCAINE HCL 1 % IJ SOLN
3.0000 mL | INTRAMUSCULAR | Status: AC | PRN
  Administered 2020-04-20: 3 mL

## 2020-04-20 NOTE — Progress Notes (Signed)
Office Visit Note   Patient: Annette Mitchell           Date of Birth: June 13, 1981           MRN: [id number] Visit Date: 04/20/2020              Requested by: Sharilyn Sites, [address],  [place] [address] PCP: Sharilyn Sites, MD   Assessment & Plan: Visit Diagnoses:  1. Chronic left shoulder pain   2. Pain in right hip     Plan: Impression is recurrent right trochanteric bursitis that responded well from previous injection.  We repeated the injection today.  For the left shoulder we sent her to Dr. Junius Roads for ultrasound-guided injection of the biceps tendon.  We will see her back as needed.  Follow-Up Instructions: Return if symptoms worsen or fail to improve.   Orders:  Orders Placed This Encounter  Procedures  . XR Shoulder Left  . XR FEMUR, MIN 2 VIEWS RIGHT   No orders of the defined types were placed in this encounter.     Procedures: Large Joint Inj: R greater trochanter on 04/20/2020 3:53 PM Indications: pain Details: 22 G needle  Arthrogram: No  Medications: 3 mL lidocaine 1 %; 3 mL bupivacaine 0.5 %; 40 mg methylPREDNISolone acetate 40 MG/ML Patient was prepped and draped in the usual sterile fashion.       Clinical Data: No additional findings.   Subjective: Chief Complaint  Patient presents with  . Left Shoulder - Pain  . Right Hip - Pain    Annette Mitchell is a 39 year old who comes in for evaluation of right hip pain and left shoulder pain.  For the right hip I saw her 2 years ago for trochanteric bursitis and she responded well to the injection.  She has pain that is reminiscent to this and she would like another injection.  No injuries to the right hip.  For the left shoulder she states that she was stretching a couple months ago when she felt a stinging sensation on the front part of her left shoulder.  Since then she has had pain.  She did see a chiropractor for this and they use some ice and heat and has not noticed any improvement.   Denies any radicular symptoms.   Review of Systems  Constitutional: Negative.   HENT: Negative.   Eyes: Negative.   Respiratory: Negative.   Cardiovascular: Negative.   Endocrine: Negative.   Musculoskeletal: Negative.   Neurological: Negative.   Hematological: Negative.   Psychiatric/Behavioral: Negative.   All other systems reviewed and are negative.    Objective: Vital Signs: Ht 5\' 1"  (1.549 m)   Wt 280 lb (127 kg)   BMI 52.91 kg/m   Physical Exam Vitals and nursing note reviewed.  Constitutional:      Appearance: She is well-developed.  Pulmonary:     Effort: Pulmonary effort is normal.  Skin:    General: Skin is warm.     Capillary Refill: Capillary refill takes less than 2 seconds.  Neurological:     Mental Status: She is alert and oriented to person, place, and time.  Psychiatric:        Behavior: Behavior normal.        Thought Content: Thought content normal.        Judgment: Judgment normal.     Ortho Exam Left shoulder shows moderate painful at the extremes of range of motion.  Manual muscle testing is slightly weak  secondary to guarding and pain.  She is tender along the proximal biceps tendon.  The right hip shows a point tenderness over the trochanteric bursa. Specialty Comments:  No specialty comments available.  Imaging: XR FEMUR, MIN 2 VIEWS RIGHT  Result Date: 04/20/2020 No acute or structural abnormalities,  No hardware complications.  XR Shoulder Left  Result Date: 04/20/2020 No acute or structural abnormalities    PMFS History: Patient Active Problem List   Diagnosis Date Noted  . OCD (obsessive compulsive disorder) 08/03/2018  . Anxiety state 08/03/2018  . Attention deficit disorder (ADD) 08/03/2018  . Vaginal pain 06/30/2015  . Dyspareunia 06/30/2015  . BV (bacterial vaginosis) 06/30/2015   Past Medical History:  Diagnosis Date  . Asthma   . BV (bacterial vaginosis) 06/30/2015  . H. pylori infection   . Headache   .  Hypertension   . Vaginal pain 06/30/2015    Family History  Problem Relation Age of Onset  . Heart attack Father   . Mental illness Sister   . Cancer Maternal Grandmother   . Cancer Maternal Grandfather   . Stroke Paternal Grandmother     Past Surgical History:  Procedure Laterality Date  . ANKLE FRACTURE SURGERY     Left ankle  . BIOPSY N/A 06/01/2015   Procedure: BIOPSY;  Surgeon: Franky Macho, MD;  [facility];  Service: Gastroenterology;  Laterality: N/A;  . csections     x3  . ESOPHAGOGASTRODUODENOSCOPY N/A 06/01/2015   Procedure: ESOPHAGOGASTRODUODENOSCOPY (EGD);  Surgeon: Franky Macho, MD;  Location: [facility];  Service: Gastroenterology;  Laterality: N/A;  . Leg surgery- Femur-metal implanted    . TONSILLECTOMY    . TUBAL LIGATION    . uretheral surgery     Social History   Occupational History  . Not on file  Tobacco Use  . Smoking status: Never Smoker  . Smokeless tobacco: Never Used  Substance and Sexual Activity  . Alcohol use: No  . Drug use: No  . Sexual activity: Yes    Birth control/protection: Surgical    Comment: tubes were removed

## 2020-04-20 NOTE — Progress Notes (Signed)
Subjective: Patient is here for ultrasound-guided left proximal biceps injection.  Objective:  Point tender over long head biceps.  Procedure: Ultrasound-guided left biceps tendon injection: After sterile prep with Betadine, injected 5 cc 1% lidocaine without epinephrine and 40 mg methylprednisolone into the biceps tendon sheath.  Modest improvement with lidocaine.

## 2020-04-29 ENCOUNTER — Other Ambulatory Visit: Payer: Self-pay | Admitting: Physician Assistant

## 2020-09-13 ENCOUNTER — Ambulatory Visit (INDEPENDENT_AMBULATORY_CARE_PROVIDER_SITE_OTHER): Payer: No Typology Code available for payment source | Admitting: Family Medicine

## 2020-09-13 ENCOUNTER — Ambulatory Visit: Payer: Self-pay

## 2020-09-13 ENCOUNTER — Other Ambulatory Visit: Payer: Self-pay

## 2020-09-13 ENCOUNTER — Encounter: Payer: Self-pay | Admitting: Family Medicine

## 2020-09-13 DIAGNOSIS — M25512 Pain in left shoulder: Secondary | ICD-10-CM | POA: Diagnosis not present

## 2020-09-13 DIAGNOSIS — G8929 Other chronic pain: Secondary | ICD-10-CM

## 2020-09-13 NOTE — Progress Notes (Signed)
Office Visit Note   Patient: Annette Mitchell           Date of Birth: 07-15-1981           MRN: [id number] Visit Date: 09/13/2020 Requested by: Annette Found, MD [address] [place],  [place] [address] PCP: Annette Found, MD  Subjective: Chief Complaint  Patient presents with  . Left Shoulder - Pain    Continues to have anterior shoulder pain. The biceps tendon injection in June last about 3-4 weeks. Would like another injection, if possible. Would like to know what else can be done for the shoulder.    HPI: She is here with recurrent left shoulder pain.  Biceps injection helped for about 3 weeks, then the pain came back.  Now she cannot sleep on her left side without pain.  She gets occasional numbness in her arm.  Denies any neck pain.  The pain seems to be on top of her shoulder and it hurts worse when trying to reach overhead.                ROS:   All other systems were reviewed and are negative.  Objective: Vital Signs: There were no vitals taken for this visit.  Physical Exam:  General:  Alert and oriented, in no acute distress. Pulm:  Breathing unlabored. Psy:  Normal mood, congruent affect. 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  She is point tender over the [facility] joint today.  She has pain with AC crossover test.  Speeds test is negative, rotator cuff strength is 5/5.  Imaging: US Guided Needle Placement - No Linked Charges  Result Date: 09/13/2020 Ultrasound-guided left AC joint injection: After sterile prep with alcohol, injected 5 cc 1% lidocaine without epinephrine and 40 mg methylprednisolone into the [facility] joint using ultrasound to guide needle placement, out of plane approach.   Assessment & Plan: 1.  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 -Discussed options with her and she wants to try an injection into the [facility] joint followed by  physical therapy.  If she fails to get long-term relief, then MRI scan.     Procedures: No procedures performed  No notes on file     PMFS History: Patient Active Problem List   Diagnosis Date Noted  . OCD (obsessive compulsive disorder) 08/03/2018  . Anxiety state 08/03/2018  . Attention deficit disorder (ADD) 08/03/2018  . Depressive disorder 09/03/2017  . Vaginal pain 06/30/2015  . Dyspareunia 06/30/2015  . BV (bacterial vaginosis) 06/30/2015  . Major depressive disorder, single episode 03/27/2000   Past Medical History:  Diagnosis Date  . Asthma   . BV (bacterial vaginosis) 06/30/2015  . H. pylori infection   . Headache   . Hypertension   . Vaginal pain 06/30/2015    Family History  Problem Relation Age of Onset  . Heart attack Father   . Mental illness Sister   . Cancer Maternal Grandmother   . Cancer Maternal Grandfather   . Stroke Paternal Grandmother     Past Surgical History:  Procedure Laterality Date  . ANKLE FRACTURE SURGERY     Left ankle  . BIOPSY N/A 06/01/2015   Procedure: BIOPSY;  Surgeon: Franky Macho, MD;  [facility];  Service: Gastroenterology;  Laterality: N/A;  . csections     x3  . ESOPHAGOGASTRODUODENOSCOPY N/A 06/01/2015   Procedure: ESOPHAGOGASTRODUODENOSCOPY (EGD);  Surgeon: Franky Macho,  MD;  [facility]: [facility];  Service: Gastroenterology;  Laterality: N/A;  . Leg surgery- Femur-metal implanted    . TONSILLECTOMY    . TUBAL LIGATION    . uretheral surgery     Social History   Occupational History  . Not on file  Tobacco Use  . Smoking status: Never Smoker  . Smokeless tobacco: Never Used  Substance and Sexual Activity  . Alcohol use: No  . Drug use: No  . Sexual activity: Yes    Birth control/protection: Surgical    Comment: tubes were removed

## 2020-10-18 ENCOUNTER — Telehealth (INDEPENDENT_AMBULATORY_CARE_PROVIDER_SITE_OTHER): Payer: No Typology Code available for payment source | Admitting: Physician Assistant

## 2020-10-18 ENCOUNTER — Encounter: Payer: Self-pay | Admitting: Physician Assistant

## 2020-10-18 ENCOUNTER — Other Ambulatory Visit: Payer: Self-pay

## 2020-10-18 DIAGNOSIS — F900 Attention-deficit hyperactivity disorder, predominantly inattentive type: Secondary | ICD-10-CM

## 2020-10-18 DIAGNOSIS — F319 Bipolar disorder, unspecified: Secondary | ICD-10-CM | POA: Diagnosis not present

## 2020-10-18 MED ORDER — AMPHETAMINE-DEXTROAMPHETAMINE 30 MG PO TABS: ORAL_TABLET | ORAL | 0 refills | Status: DC

## 2020-10-18 MED ORDER — QUETIAPINE FUMARATE 400 MG PO TABS: 400.0000 mg | ORAL_TABLET | Freq: Every day | ORAL | 1 refills | Status: DC

## 2020-10-18 NOTE — Progress Notes (Signed)
[facility]  Patient ID: Annette Mitchell,  MRN: [id number]  PCP: Assunta Found, MD  Date of Evaluation: 10/18/2020 Time spent:30 minutes  Chief Complaint:  Chief Complaint    Depression; Fatigue     Virtual Visit via Telehealth  I connected with patient by telephone, with their informed consent, and verified patient privacy and that I am speaking with the correct person using two identifiers.  I am private, in my office and the patient is at home.  I discussed the limitations, risks, security and privacy concerns of performing an evaluation and management service by telephone and the availability of in person appointments. I also discussed with the patient that there may be a patient responsible charge related to this service. The patient expressed understanding and agreed to proceed.   I discussed the assessment and treatment plan with the patient. The patient was provided an opportunity to ask questions and all were answered. The patient agreed with the plan and demonstrated an understanding of the instructions.   The patient was advised to call back or seek an in-person evaluation if the symptoms worsen or if the condition fails to improve as anticipated.  I provided 30 minutes of non-face-to-face time during this encounter.  HISTORY/CURRENT STATUS: HPI overdue for routine med check.  Doing really well mentally. She's able to enjoy things, energy and motivation are low but that is not any different.  States she has been tired all of her life.  Does not sleep well, does not snore but she never feels rested when she gets up.  The Adderall does help that a lot.  She is also able to focus and finish tasks in a timely manner.  No change in appetite.  Not isolating.  Denies suicidal or homicidal thoughts.  Not having a lot of anxiety at this time.  Feels that the Seroquel has really helped everything.  We did increase it since her last visit but she felt like it was too  strong so she decreased back to the original dose of 400 mg.  States it is working well she feels much better.  Patient denies increased energy with decreased need for sleep, no increased talkativeness, no racing thoughts, no impulsivity or risky behaviors, no increased spending, no increased libido, no grandiosity.  Denies dizziness, syncope, seizures, numbness, tingling, tremor, tics, unsteady gait, slurred speech, confusion. Denies muscle or joint pain, stiffness, or dystonia.  Individual Medical History/ Review of Systems: Changes? :No    Past medications for mental health diagnoses include: Cymbalta, Prozac, Zoloft, Celexa, Paxil, Lexapro, Pristiq caused paranoia,  Wellbutrin, lithium, Seroquel, Latuda, Lamictal, Vraylar caused migraines, Lunesta, Sonata, Restoril, Rexulti, Abilify, Ambien  Allergies: Latex, Betadine [povidone iodine], and Pristiq [desvenlafaxine]  Current Medications:  Current Outpatient Medications:  .  metoprolol tartrate (LOPRESSOR) 50 MG tablet, Take 50 mg by mouth 2 (two) times daily., Disp: , Rfl:  .  PROAIR HFA 108 (90 BASE) MCG/ACT inhaler, INHALE 2 PUFFS EVERY 4 TO 6 HOURS IF NEEDED, Disp: , Rfl: 3 .  QUEtiapine (SEROQUEL) 400 MG tablet, Take 1 tablet (400 mg total) by mouth at bedtime., Disp: 90 tablet, Rfl: 1 .  [START ON 12/17/2020] amphetamine-dextroamphetamine (ADDERALL) 30 MG tablet, 1 po q am, 1/2 at noon., Disp: 45 tablet, Rfl: 0 .  [START ON 11/17/2020] amphetamine-dextroamphetamine (ADDERALL) 30 MG tablet, 1 po q am, 1/2 po at lunch, Disp: 45 tablet, Rfl: 0 .  amphetamine-dextroamphetamine (ADDERALL) 30 MG tablet, 1 qam, 1/2 at lunch, Disp: 45 tablet,  Rfl: 0 .  hydrochlorothiazide (HYDRODIURIL) 25 MG tablet, Take 25 mg by mouth daily. (Patient not taking: Reported on 09/13/2020), Disp: , Rfl:  .  HYDROcodone-acetaminophen (NORCO/VICODIN) 5-325 MG tablet, Take 1 tablet by mouth every 6 (six) hours as needed for moderate pain. (Patient not taking: Reported on  09/13/2020), Disp: 10 tablet, Rfl: 0 .  levothyroxine (SYNTHROID, LEVOTHROID) 50 MCG tablet, , Disp: , Rfl:  .  meclizine (ANTIVERT) 25 MG tablet, , Disp: , Rfl:  .  traZODone (DESYREL) 100 MG tablet, trazodone (Patient not taking: Reported on 09/13/2020), Disp: , Rfl:  .  zaleplon (SONATA) 10 MG capsule, 1 po qhs prn and may repeat 1 prn for MNA if she has at least 3 hours left to sleep. (Patient not taking: Reported on 09/13/2020), Disp: 60 capsule, Rfl: 1 Medication Side Effects: none  Family Medical/ Social History: Changes?  Her stepmother died of COVID several months ago.  That was hard but she is doing okay for the most part.  MENTAL HEALTH EXAM:  There were no vitals taken for this visit.There is no height or weight on file to calculate BMI.  General Appearance: Unable to assess  Eye Contact:  Unable to assess  Speech:  Clear and Coherent  Volume:  Normal  Mood:  Euthymic  Affect:  Unable to assess  Thought Process:  Goal Directed and Descriptions of Associations: Intact  Orientation:  Full (Time, Place, and Person)  Thought Content: Logical   Suicidal Thoughts:  No  Homicidal Thoughts:  No  Memory:  WNL  Judgement:  Good  Insight:  Good  Psychomotor Activity:  Unable to assess  Concentration:  Concentration: Good and Attention Span: Good  Recall:  Good  Fund of Knowledge: Good  Language: Good  Assets:  Desire for Improvement  ADL's:  Intact  Cognition: WNL  Prognosis:  Good    DIAGNOSES:    ICD-10-CM   1. Bipolar I disorder (HCC)  F31.9   2. Attention deficit hyperactivity disorder (ADHD), predominantly inattentive type  F90.0     Receiving Psychotherapy: No    RECOMMENDATIONS:  PDMP was reviewed. I provided 30 minutes of nonface-to-face time during this encounter, including time spent reviewing chart looking for lab results.  None are current. I am glad to see that she is doing so well! We discussed labs.  She will be seeing her PCP soon and will have glucose  as well as a lipid panel drawn.  She will have those sent to me.  She understands that Seroquel can possibly lead to increased glucose as well as lipids and those need to be monitored at least annually. She is doing well on the Adderall so no changes will be made there. Continue Adderall 30 mg, 1 p.o. every morning and one half p.o. daily around noon. Continue Seroquel 400 mg, 1 p.o. nightly. Return in 6 months.   Melony Overly, PA-C

## 2021-01-03 ENCOUNTER — Telehealth: Payer: Self-pay | Admitting: Physician Assistant

## 2021-01-03 ENCOUNTER — Other Ambulatory Visit: Payer: Self-pay | Admitting: Physician Assistant

## 2021-01-03 MED ORDER — AMPHETAMINE-DEXTROAMPHETAMINE 30 MG PO TABS: ORAL_TABLET | ORAL | 0 refills | Status: DC

## 2021-01-03 NOTE — Telephone Encounter (Signed)
Next visit is 04/19/21. Requesting refill on Adderall 60 mg called to [facility], Store [phone].

## 2021-01-03 NOTE — Telephone Encounter (Signed)
3 prescriptions were sent. 

## 2021-01-27 ENCOUNTER — Telehealth: Payer: Self-pay | Admitting: Physician Assistant

## 2021-01-27 NOTE — Telephone Encounter (Signed)
Pt called to advise insurance will only cover 90 day Rx for Seroquel. Rx to [facility]. Apt 6/7

## 2021-01-28 ENCOUNTER — Other Ambulatory Visit: Payer: Self-pay

## 2021-01-28 MED ORDER — QUETIAPINE FUMARATE 400 MG PO TABS: 400.0000 mg | ORAL_TABLET | Freq: Every day | ORAL | 1 refills | Status: DC

## 2021-01-28 NOTE — Telephone Encounter (Signed)
Pt already has a 90 day refill but will resend.

## 2021-04-19 ENCOUNTER — Encounter: Payer: Self-pay | Admitting: Physician Assistant

## 2021-04-19 ENCOUNTER — Ambulatory Visit (INDEPENDENT_AMBULATORY_CARE_PROVIDER_SITE_OTHER): Payer: No Typology Code available for payment source | Admitting: Physician Assistant

## 2021-04-19 DIAGNOSIS — F429 Obsessive-compulsive disorder, unspecified: Secondary | ICD-10-CM | POA: Diagnosis not present

## 2021-04-19 DIAGNOSIS — F319 Bipolar disorder, unspecified: Secondary | ICD-10-CM

## 2021-04-19 DIAGNOSIS — F900 Attention-deficit hyperactivity disorder, predominantly inattentive type: Secondary | ICD-10-CM | POA: Diagnosis not present

## 2021-04-19 MED ORDER — QUETIAPINE FUMARATE 400 MG PO TABS: 400.0000 mg | ORAL_TABLET | Freq: Every day | ORAL | 1 refills | Status: DC

## 2021-04-19 MED ORDER — AMPHETAMINE-DEXTROAMPHETAMINE 30 MG PO TABS: ORAL_TABLET | ORAL | 0 refills | Status: DC

## 2021-04-19 NOTE — Progress Notes (Signed)
[facility]  Patient ID: Annette Mitchell,  MRN: [id number]  PCP: Assunta Found, MD  Date of Evaluation: 04/19/2021 Time spent:25 minutes  Chief Complaint:  Chief Complaint    Depression; ADHD; Follow-up     Virtual Visit via Telehealth  I connected with patient by telephone, with their informed consent, and verified patient privacy and that I am speaking with the correct person using two identifiers.  I am private, in my office and the patient is at home.  I discussed the limitations, risks, security and privacy concerns of performing an evaluation and management service by telephone and the availability of in person appointments. I also discussed with the patient that there may be a patient responsible charge related to this service. The patient expressed understanding and agreed to proceed.   I discussed the assessment and treatment plan with the patient. The patient was provided an opportunity to ask questions and all were answered. The patient agreed with the plan and demonstrated an understanding of the instructions.   The patient was advised to call back or seek an in-person evaluation if the symptoms worsen or if the condition fails to improve as anticipated.  I provided 25 minutes of non-face-to-face time during this encounter.  HISTORY/CURRENT STATUS: HPI  for routine med check.  States she is doing well mentally.  She does have a couple of days per month, the first 2 days of her menstrual cycle usually, where she feels a little more depressed.  Is able to work through it and it passes pretty quickly.  The remainder of the days each month, she is in a pretty stable place mentally.  She is not having much anxiety at all.  Her dad recently had a stroke and of course that has made her anxious but not what she would consider out of the ordinary.  He is at home getting PT there.  She will be going to visit them for several weeks toward the middle of the month.  They  live in [place].  OCD symptoms are controlled.  Patient denies loss of interest in usual activities and is able to enjoy things.  Denies decreased energy or motivation.  Appetite has not changed.  No extreme sadness, tearfulness, or feelings of hopelessness. Denies suicidal or homicidal thoughts.  Patient denies increased energy with decreased need for sleep, no increased talkativeness, no racing thoughts, no impulsivity or risky behaviors, no increased spending, no increased libido, no grandiosity.  States that attention is good without easy distractibility.  Able to focus on things and finish tasks to completion.   Denies dizziness, syncope, seizures, numbness, tingling, tremor, tics, unsteady gait, slurred speech, confusion. Denies muscle or joint pain, stiffness, or dystonia.  Individual Medical History/ Review of Systems: Changes? :No    Past medications for mental health diagnoses include: Cymbalta, Prozac, Zoloft, Celexa, Paxil, Lexapro, Pristiq caused paranoia,  Wellbutrin, lithium, Seroquel, Latuda, Lamictal, Vraylar caused migraines, Lunesta, Sonata, Restoril, Rexulti, Abilify, Ambien  Allergies: Latex, Betadine [povidone iodine], and Pristiq [desvenlafaxine]  Current Medications:  Current Outpatient Medications:  .  zaleplon (SONATA) 10 MG capsule, 1 po qhs prn and may repeat 1 prn for MNA if she has at least 3 hours left to sleep., Disp: 60 capsule, Rfl: 1 .  [START ON 06/17/2021] amphetamine-dextroamphetamine (ADDERALL) 30 MG tablet, 1 po q am, 1/2 po at lunch, Disp: 45 tablet, Rfl: 0 .  [START ON 05/18/2021] amphetamine-dextroamphetamine (ADDERALL) 30 MG tablet, 1 qam, 1/2 at lunch, Disp: 45 tablet, Rfl:  0 .  amphetamine-dextroamphetamine (ADDERALL) 30 MG tablet, 1 po q am, 1/2 at noon., Disp: 45 tablet, Rfl: 0 .  hydrochlorothiazide (HYDRODIURIL) 25 MG tablet, Take 25 mg by mouth daily. (Patient not taking: Reported on 09/13/2020), Disp: , Rfl:  .  HYDROcodone-acetaminophen  (NORCO/VICODIN) 5-325 MG tablet, Take 1 tablet by mouth every 6 (six) hours as needed for moderate pain. (Patient not taking: Reported on 09/13/2020), Disp: 10 tablet, Rfl: 0 .  levothyroxine (SYNTHROID, LEVOTHROID) 50 MCG tablet, , Disp: , Rfl:  .  meclizine (ANTIVERT) 25 MG tablet, , Disp: , Rfl:  .  metoprolol tartrate (LOPRESSOR) 50 MG tablet, Take 50 mg by mouth 2 (two) times daily. (Patient not taking: Reported on 04/19/2021), Disp: , Rfl:  .  PROAIR HFA 108 (90 BASE) MCG/ACT inhaler, INHALE 2 PUFFS EVERY 4 TO 6 HOURS IF NEEDED, Disp: , Rfl: 3 .  QUEtiapine (SEROQUEL) 400 MG tablet, Take 1 tablet (400 mg total) by mouth at bedtime., Disp: 90 tablet, Rfl: 1 .  traZODone (DESYREL) 100 MG tablet, trazodone (Patient not taking: No sig reported), Disp: , Rfl:  Medication Side Effects: none  Family Medical/ Social History: Changes?  Her dad had a stroke, is home now.  MENTAL HEALTH EXAM:  There were no vitals taken for this visit.There is no height or weight on file to calculate BMI.  General Appearance: Unable to assess  Eye Contact:  Unable to assess  Speech:  Clear and Coherent  Volume:  Normal  Mood:  Euthymic  Affect:  Unable to assess  Thought Process:  Goal Directed and Descriptions of Associations: Intact  Orientation:  Full (Time, Place, and Person)  Thought Content: Logical   Suicidal Thoughts:  No  Homicidal Thoughts:  No  Memory:  WNL  Judgement:  Good  Insight:  Good  Psychomotor Activity:  Unable to assess  Concentration:  Concentration: Good and Attention Span: Good  Recall:  Good  Fund of Knowledge: Good  Language: Good  Assets:  Desire for Improvement  ADL's:  Intact  Cognition: WNL  Prognosis:  Good    DIAGNOSES:    ICD-10-CM   1. Bipolar I disorder (HCC)  F31.9   2. Attention deficit hyperactivity disorder (ADHD), predominantly inattentive type  F90.0   3. Obsessive-compulsive disorder, unspecified type  F42.9     Receiving Psychotherapy: No     RECOMMENDATIONS:  PDMP was reviewed. I provided 25 minutes of nonface-to-face time during this encounter, including time spent before and after the visit in records review, medical decision making, and charting. She has an appointment coming up soon for a complete physical and will have labs sent to me at that point. As far as her psych medications are concerned, she is doing great so no changes will be made. Continue Adderall 30 mg, 1 p.o. every morning and one half p.o. daily around noon. Continue Seroquel 400 mg, 1 p.o. nightly. Return in 6 months.  Melony Overly, PA-C

## 2021-04-21 ENCOUNTER — Telehealth: Payer: Self-pay

## 2021-04-21 NOTE — Telephone Encounter (Signed)
Prior approval received for AMPHETAMINE-DEXTROAMPHETAMINE 30 MG 1.5 TABLETS DAILY effective 04/21/2021-04/20/2024 with [employer].     Pt is aware of approval   Submitted through cover my meds

## 2021-07-06 ENCOUNTER — Telehealth: Payer: Self-pay | Admitting: Physician Assistant

## 2021-07-06 NOTE — Telephone Encounter (Signed)
Pt called to state that her prescription for Adderall is backed up at all the pharmacies due to a "shortage." She would like to know if she could be prescribed something else. Pls Rtc [phone]

## 2021-07-06 NOTE — Telephone Encounter (Signed)
There's an Adderall shortage, not sure the expectancy of this.

## 2021-07-08 NOTE — Telephone Encounter (Signed)
I can send in Dexedrine. Which pharmacy?

## 2021-07-12 ENCOUNTER — Other Ambulatory Visit: Payer: Self-pay | Admitting: Physician Assistant

## 2021-07-12 MED ORDER — DEXTROAMPHETAMINE SULFATE 10 MG PO TABS: 10.0000 mg | ORAL_TABLET | Freq: Two times a day (BID) | ORAL | 0 refills | Status: DC

## 2021-07-12 NOTE — Telephone Encounter (Signed)
Prescription for Dexedrine 10 mg was sent.  She will take 1 at breakfast and 1 at lunch.

## 2021-07-12 NOTE — Telephone Encounter (Signed)
Pt notified and will try Dexedrine. Send to [facility] on [address] in [place]

## 2021-07-13 NOTE — Telephone Encounter (Signed)
Medication does require prior authorization with [employer], it's approved effective 07/13/2021-07/12/2024 for DEXTROAMPHETAMINE-SULFATE 10 MG #60.   [phone]  The cost can now be checked at her pharmacy to get the actual price since PA is approved

## 2021-07-20 NOTE — Telephone Encounter (Signed)
Annette Mitchell called and LM because the prescription sent in last week is still not right.  She is still experiencing problems.  Please call to discuss

## 2021-07-20 NOTE — Telephone Encounter (Signed)
The pharmacy informed her they also can not fill the rx sent because they dont have it in stock either.[facility]

## 2021-07-21 ENCOUNTER — Other Ambulatory Visit: Payer: Self-pay | Admitting: Physician Assistant

## 2021-07-21 MED ORDER — METHYLPHENIDATE HCL 10 MG PO TABS: 10.0000 mg | ORAL_TABLET | Freq: Two times a day (BID) | ORAL | 0 refills | Status: DC

## 2021-07-21 NOTE — Telephone Encounter (Signed)
Pt will call back and let front office know a pharmacy and if its ok for her to take this med due to insurance

## 2021-07-21 NOTE — Telephone Encounter (Signed)
Please review

## 2021-07-21 NOTE — Telephone Encounter (Signed)
Pt called back and advised her insurance won't cover the generic of Concerta, so she wants Rx for Ritalin.  Pls send to [facility], [address] in [place].

## 2021-07-21 NOTE — Telephone Encounter (Signed)
Prescription for Ritalin was sent.

## 2021-07-21 NOTE — Telephone Encounter (Signed)
Please let her know we are going to have to change from an amphetamine which is what Adderall and Dexedrine are, to a methylphenidate.  It is just a different family of stimulants used to treat ADD/ADHD.  I do not see where she has ever tried any drug in that class.  I will send in Concerta.  It is a long acting methylphenidate.  I do not think there is a Geneticist, molecular.  [facility].  Thank you.

## 2021-07-26 ENCOUNTER — Other Ambulatory Visit: Payer: Self-pay

## 2021-07-26 NOTE — Telephone Encounter (Signed)
Prior Authorization submitted and approved for METHYLPHENIDATE 10 MG #60 effective 07/26/2021-07/25/2024  With [employer]

## 2021-08-12 ENCOUNTER — Encounter: Payer: Self-pay | Admitting: *Deleted

## 2021-08-16 ENCOUNTER — Ambulatory Visit: Payer: No Typology Code available for payment source | Admitting: Cardiovascular Disease

## 2021-09-12 ENCOUNTER — Ambulatory Visit (INDEPENDENT_AMBULATORY_CARE_PROVIDER_SITE_OTHER): Payer: No Typology Code available for payment source

## 2021-09-12 ENCOUNTER — Encounter (INDEPENDENT_AMBULATORY_CARE_PROVIDER_SITE_OTHER): Payer: Self-pay

## 2021-09-12 ENCOUNTER — Other Ambulatory Visit: Payer: Self-pay

## 2021-09-12 ENCOUNTER — Ambulatory Visit: Payer: No Typology Code available for payment source | Admitting: Cardiology

## 2021-09-12 ENCOUNTER — Encounter: Payer: Self-pay | Admitting: Cardiology

## 2021-09-12 VITALS — BP 138/88 | HR 100 | Wt 318.0 lb

## 2021-09-12 DIAGNOSIS — R609 Edema, unspecified: Secondary | ICD-10-CM

## 2021-09-12 DIAGNOSIS — Z8249 Family history of ischemic heart disease and other diseases of the circulatory system: Secondary | ICD-10-CM | POA: Diagnosis not present

## 2021-09-12 DIAGNOSIS — R002 Palpitations: Secondary | ICD-10-CM

## 2021-09-12 MED ORDER — METOPROLOL TARTRATE 50 MG PO TABS: 50.0000 mg | ORAL_TABLET | Freq: Every day | ORAL | 3 refills | Status: DC

## 2021-09-12 NOTE — Patient Instructions (Signed)
Medication Instructions:  Please take Metoprolol Tartrate 50 mg at bedtime. Continue all other medications as listed.  *If you need a refill on your cardiac medications before your next appointment, please call your pharmacy*  Testing/Procedures: ZIO XT- Long Term Monitor Instructions  Your physician has requested you wear a ZIO patch monitor for 14 days.  This is a single patch monitor. [employer] supplies one patch monitor per enrollment. Additional stickers are not available. Please do not apply patch if you will be having a Nuclear Stress Test,  Echocardiogram, Cardiac CT, MRI, or Chest Xray during the period you would be wearing the  monitor. The patch cannot be worn during these tests. You cannot remove and re-apply the  ZIO XT patch monitor.  Your ZIO patch monitor will be mailed 3 day USPS to your address on file. It may take 3-5 days  to receive your monitor after you have been enrolled.  Once you have received your monitor, please review the enclosed instructions. Your monitor  has already been registered assigning a specific monitor serial # to you.  Billing and Patient Assistance Program Information  We have supplied [employer] with any of your insurance information on file for billing purposes. [employer] offers a sliding scale Patient Assistance Program for patients that do not have  insurance, or whose insurance does not completely cover the cost of the ZIO monitor.  You must apply for the Patient Assistance Program to qualify for this discounted rate.  To apply, please call [employer] at [phone], select option 4, select option 2, ask to apply for  Patient Assistance Program. Meredeth Ide will ask your household income, and how many people  are in your household. They will quote your out-of-pocket cost based on that information.  [employer] will also be able to set up a 56-month, interest-free payment plan if needed.  Applying the monitor   Shave hair from upper left chest.  Hold  abrader disc by orange tab. Rub abrader in 40 strokes over the upper left chest as  indicated in your monitor instructions.  Clean area with 4 enclosed alcohol pads. Let dry.  Apply patch as indicated in monitor instructions. Patch will be placed under collarbone on left  side of chest with arrow pointing upward.  Rub patch adhesive wings for 2 minutes. Remove white label marked "1". Remove the white  label marked "2". Rub patch adhesive wings for 2 additional minutes.  While looking in a mirror, press and release button in center of patch. A small green light will  flash 3-4 times. This will be your only indicator that the monitor has been turned on.  Do not shower for the first 24 hours. You may shower after the first 24 hours.  Press the button if you feel a symptom. You will hear a small click. Record Date, Time and  Symptom in the Patient Logbook.  When you are ready to remove the patch, follow instructions on the last 2 pages of Patient  Logbook. Stick patch monitor onto the last page of Patient Logbook.  Place Patient Logbook in the blue and white box. Use locking tab on box and tape box closed  securely. The blue and white box has prepaid postage on it. Please place it in the mailbox as  soon as possible. Your physician should have your test results approximately 7 days after the  monitor has been mailed back to [facility].  Call [facility] Customer Care at [phone] if you have questions regarding  your ZIO  XT patch monitor. Call them immediately if you see an orange light blinking on your  monitor.  If your monitor falls off in less than 4 days, contact our Monitor department at [phone].  If your monitor becomes loose or falls off after 4 days call [employer] at [phone] for  suggestions on securing your monitor  Your physician has requested that you have Coronary Calcium score which is completed by CT. Cardiac computed tomography (CT) is a painless test that  uses an x-ray machine to take clear, detailed pictures of your heart. For further information please visit [email or web address]  You will be scheduled for this testing at [facility].  There are no instructions/restrictions.  Follow-Up: At [facility], you and your health needs are our priority.  As part of our continuing mission to provide you with exceptional heart care, we have created designated Provider Care Teams.  These Care Teams include your primary Cardiologist (physician) and Advanced Practice Providers (APPs -  Physician Assistants and Nurse Practitioners) who all work together to provide you with the care you need, when you need it.  We recommend signing up for the patient portal called "MyChart".  Sign up information is provided on this After Visit Summary.  MyChart is used to connect with patients for Virtual Visits (Telemedicine).  Patients are able to view lab/test results, encounter notes, upcoming appointments, etc.  Non-urgent messages can be sent to your provider as well.   To learn more about what you can do with MyChart, go to [email or web address].    Your next appointment:   Follow up will be based on the results of the above testing.  Thank you for choosing [facility]!!

## 2021-09-12 NOTE — Assessment & Plan Note (Signed)
Continue to encourage weight loss, decrease carbohydrates, daily exercise.

## 2021-09-12 NOTE — Progress Notes (Signed)
Cardiology Office Note:    Date:  09/12/2021   ID:  Annette Mitchell, DOB 12/31/1980, MRN [id number]  PCP:  Assunta Found, MD   [facility] Providers Cardiologist:  None     Referring MD: Assunta Found, MD    History of Present Illness:    Annette Mitchell is a 40 y.o. female here for the evaluation of palpitations at the request of Dr. Assunta Found.  Currently taking Ritalin.  Previously had been prescribed metoprolol tartrate 50 mg.  Feels a bubbling feeling. Has CP intermittently all of the time, 10-15 min duration. Can happen at rest.  Not with exertion.  Gurgling feeling more when laying. No GERD. Rare heart burn. Feels like take breath No syncope.   Also has lower extremity edema  Father has early coronary artery disease- CAD 6 stents. 42 first MI.     Past Medical History:  Diagnosis Date   Asthma    BV (bacterial vaginosis) 06/30/2015   H. pylori infection    Headache    Hypertension    Vaginal pain 06/30/2015    Past Surgical History:  Procedure Laterality Date   ANKLE FRACTURE SURGERY     Left ankle   BIOPSY N/A 06/01/2015   Procedure: BIOPSY;  Surgeon: Franky Macho, MD;  Location: [facility];  Service: Gastroenterology;  Laterality: N/A;   csections     x3   ESOPHAGOGASTRODUODENOSCOPY N/A 06/01/2015   Procedure: ESOPHAGOGASTRODUODENOSCOPY (EGD);  Surgeon: Franky Macho, MD;  Location: [facility];  Service: Gastroenterology;  Laterality: N/A;   Leg surgery- Femur-metal implanted     TONSILLECTOMY     TUBAL LIGATION     uretheral surgery      Current Medications: Current Meds  Medication Sig   methylphenidate (RITALIN) 10 MG tablet Take 1 tablet (10 mg total) by mouth 2 (two) times daily.   PROAIR HFA 108 (90 BASE) MCG/ACT inhaler INHALE 2 PUFFS EVERY 4 TO 6 HOURS IF NEEDED   QUEtiapine (SEROQUEL) 400 MG tablet Take 1 tablet (400 mg total) by mouth at bedtime.     Allergies:   Latex, Betadine [povidone iodine], and Pristiq [desvenlafaxine]    Social History   Socioeconomic History   Marital status: Married    Spouse name: Not on file   Number of children: Not on file   Years of education: Not on file   Highest education level: Not on file  Occupational History   Not on file  Tobacco Use   Smoking status: Never   Smokeless tobacco: Never  Vaping Use   Vaping Use: Never used  Substance and Sexual Activity   Alcohol use: No   Drug use: No   Sexual activity: Yes    Birth control/protection: Surgical    Comment: tubes were removed  Other Topics Concern   Not on file  Social History Narrative   Not on file   Social Determinants of Health   Financial Resource Strain: Not on file  Food Insecurity: Not on file  Transportation Needs: Not on file  Physical Activity: Not on file  Stress: Not on file  Social Connections: Not on file     Family History: The patient's family history includes Cancer in her maternal grandfather and maternal grandmother; Heart attack in her father; Mental illness in her sister; Stroke in her paternal grandmother.  ROS:   Please see the history of present illness.     All other systems reviewed and are negative.  EKGs/Labs/Other Studies Reviewed:  The following studies were reviewed today: Prior office notes reviewed prior EKG reviewed, lab work reviewed  EKG: Sinus rhythm 88 no other abnormalities.  From outside office on 06/23/2021 personally reviewed and interpreted.  Recent Labs: No results found for requested labs within last 8760 hours.  Recent Lipid Panel    Component Value Date/Time   CHOL 206 (H) 07/29/2015 1039   TRIG 151 (H) 07/29/2015 1039   HDL 46 07/29/2015 1039   CHOLHDL 4.5 (H) 07/29/2015 1039   LDLCALC 130 (H) 07/29/2015 1039     Risk Assessment/Calculations:          Physical Exam:    VS:  BP 138/88 (BP Location: Right Arm)   Pulse 100   Wt (!) 318 lb (144.2 kg)   SpO2 99%   BMI 60.09 kg/m     Wt Readings from Last 3 Encounters:  09/12/21  (!) 318 lb (144.2 kg)  04/20/20 280 lb (127 kg)  08/05/19 280 lb (127 kg)     GEN:  Well nourished, well developed in no acute distress HEENT: Normal NECK: No JVD; No carotid bruits LYMPHATICS: No lymphadenopathy CARDIAC: RRR, no murmurs, rubs, gallops RESPIRATORY:  Clear to auscultation without rales, wheezing or rhonchi  ABDOMEN: Soft, non-tender, non-distended MUSCULOSKELETAL: Mild lower extremity edema; No deformity  SKIN: Warm and dry NEUROLOGIC:  Alert and oriented x 3 PSYCHIATRIC:  Normal affect   ASSESSMENT:    1. Palpitations   2. Family history of coronary artery disease in father   3. Dependent edema   4. Morbid obesity (HCC)   5. Family history of early CAD    PLAN:    In order of problems listed above:  Palpitations We will go ahead and place a Zio patch monitor.  Her symptoms are suggestive of PVCs or PACs.  She does not feel as though they are gastric related.  She has no high risk symptoms such as syncope.  I will also go ahead and prescribe her once again her metoprolol tartrate 50 mg to be taken at bedtime.  She says that this is helped in the past with her racing heartbeat at night.  Dependent edema Conservative measures such as compression stockings, leg elevation, decrease sodium intake, exercise/walking can help with dependent edema.  Continue to encourage weight loss.  Morbid obesity (HCC) Continue to encourage weight loss, decrease carbohydrates, daily exercise.  Family history of early CAD Father MI age 62.  I think it would be beneficial for her to get a coronary calcium score, $99 test, in order to determine if she has any coronary plaque present.  If plaque is present, we would utilize Crestor for prevention strategy.     We will follow-up with results of testing.   Medication Adjustments/Labs and Tests Ordered: Current medicines are reviewed at length with the patient today.  Concerns regarding medicines are outlined above.  Orders Placed  This Encounter  Procedures   CT CARDIAC SCORING (SELF PAY ONLY)   LONG TERM MONITOR (3-14 DAYS)    Meds ordered this encounter  Medications   metoprolol tartrate (LOPRESSOR) 50 MG tablet    Sig: Take 1 tablet (50 mg total) by mouth at bedtime.    Dispense:  90 tablet    Refill:  3     Patient Instructions  Medication Instructions:  Please take Metoprolol Tartrate 50 mg at bedtime. Continue all other medications as listed.  *If you need a refill on your cardiac medications before your next appointment, please call  your pharmacy*  Testing/Procedures: Christena Deem- Long Term Monitor Instructions  Your physician has requested you wear a ZIO patch monitor for 14 days.  This is a single patch monitor. [employer] supplies one patch monitor per enrollment. Additional stickers are not available. Please do not apply patch if you will be having a Nuclear Stress Test,  Echocardiogram, Cardiac CT, MRI, or Chest Xray during the period you would be wearing the  monitor. The patch cannot be worn during these tests. You cannot remove and re-apply the  ZIO XT patch monitor.  Your ZIO patch monitor will be mailed 3 day USPS to your address on file. It may take 3-5 days  to receive your monitor after you have been enrolled.  Once you have received your monitor, please review the enclosed instructions. Your monitor  has already been registered assigning a specific monitor serial # to you.  Billing and Patient Assistance Program Information  We have supplied [employer] with any of your insurance information on file for billing purposes. [employer] offers a sliding scale Patient Assistance Program for patients that do not have  insurance, or whose insurance does not completely cover the cost of the ZIO monitor.  You must apply for the Patient Assistance Program to qualify for this discounted rate.  To apply, please call [employer] at [phone], select option 4, select option 2, ask to apply for  Patient  Assistance Program. Meredeth Ide will ask your household income, and how many people  are in your household. They will quote your out-of-pocket cost based on that information.  [employer] will also be able to set up a 44-month, interest-free payment plan if needed.  Applying the monitor   Shave hair from upper left chest.  Hold abrader disc by orange tab. Rub abrader in 40 strokes over the upper left chest as  indicated in your monitor instructions.  Clean area with 4 enclosed alcohol pads. Let dry.  Apply patch as indicated in monitor instructions. Patch will be placed under collarbone on left  side of chest with arrow pointing upward.  Rub patch adhesive wings for 2 minutes. Remove white label marked "1". Remove the white  label marked "2". Rub patch adhesive wings for 2 additional minutes.  While looking in a mirror, press and release button in center of patch. A small green light will  flash 3-4 times. This will be your only indicator that the monitor has been turned on.  Do not shower for the first 24 hours. You may shower after the first 24 hours.  Press the button if you feel a symptom. You will hear a small click. Record Date, Time and  Symptom in the Patient Logbook.  When you are ready to remove the patch, follow instructions on the last 2 pages of Patient  Logbook. Stick patch monitor onto the last page of Patient Logbook.  Place Patient Logbook in the blue and white box. Use locking tab on box and tape box closed  securely. The blue and white box has prepaid postage on it. Please place it in the mailbox as  soon as possible. Your physician should have your test results approximately 7 days after the  monitor has been mailed back to [facility].  Call [facility] Customer Care at [phone] if you have questions regarding  your ZIO XT patch monitor. Call them immediately if you see an orange light blinking on your  monitor.  If your monitor falls off in less than 4 days,  contact our Monitor department  at [phone].  If your monitor becomes loose or falls off after 4 days call [employer] at [phone] for  suggestions on securing your monitor  Your physician has requested that you have Coronary Calcium score which is completed by CT. Cardiac computed tomography (CT) is a painless test that uses an x-ray machine to take clear, detailed pictures of your heart. For further information please visit [email or web address]  You will be scheduled for this testing at [name].  There are no instructions/restrictions.  Follow-Up: At [facility], you and your health needs are our priority.  As part of our continuing mission to provide you with exceptional heart care, we have created designated Provider Care Teams.  These Care Teams include your primary Cardiologist (physician) and Advanced Practice Providers (APPs -  Physician Assistants and Nurse Practitioners) who all work together to provide you with the care you need, when you need it.  We recommend signing up for the patient portal called "MyChart".  Sign up information is provided on this After Visit Summary.  MyChart is used to connect with patients for Virtual Visits (Telemedicine).  Patients are able to view lab/test results, encounter notes, upcoming appointments, etc.  Non-urgent messages can be sent to your provider as well.   To learn more about what you can do with MyChart, go to [email or web address].    Your next appointment:   Follow up will be based on the results of the above testing.  Thank you for choosing [facility]!!     Signed, Donato Schultz, MD  09/12/2021 11:47 AM    [facility]

## 2021-09-12 NOTE — Assessment & Plan Note (Addendum)
We will go ahead and place a Zio patch monitor.  Her symptoms are suggestive of PVCs or PACs.  She does not feel as though they are gastric related.  She has no high risk symptoms such as syncope.  I will also go ahead and prescribe her once again her metoprolol tartrate 50 mg to be taken at bedtime.  She says that this is helped in the past with her racing heartbeat at night.

## 2021-09-12 NOTE — Assessment & Plan Note (Signed)
Conservative measures such as compression stockings, leg elevation, decrease sodium intake, exercise/walking can help with dependent edema.  Continue to encourage weight loss.

## 2021-09-12 NOTE — Assessment & Plan Note (Signed)
Father MI age 42.  I think it would be beneficial for her to get a coronary calcium score, $99 test, in order to determine if she has any coronary plaque present.  If plaque is present, we would utilize Crestor for prevention strategy.

## 2021-09-30 ENCOUNTER — Other Ambulatory Visit: Payer: Self-pay | Admitting: Neurosurgery

## 2021-09-30 DIAGNOSIS — M5416 Radiculopathy, lumbar region: Secondary | ICD-10-CM

## 2021-10-26 ENCOUNTER — Ambulatory Visit (HOSPITAL_COMMUNITY): Payer: No Typology Code available for payment source

## 2021-11-03 ENCOUNTER — Ambulatory Visit
Admission: RE | Admit: 2021-11-03 | Discharge: 2021-11-03 | Disposition: A | Discharge status: Home / Self Care | Payer: No Typology Code available for payment source | Source: Ambulatory Visit | Attending: Neurosurgery | Admitting: Neurosurgery

## 2021-11-03 ENCOUNTER — Other Ambulatory Visit: Payer: Self-pay

## 2021-11-03 DIAGNOSIS — M5416 Radiculopathy, lumbar region: Secondary | ICD-10-CM

## 2021-12-06 ENCOUNTER — Ambulatory Visit (HOSPITAL_COMMUNITY)
Admission: RE | Admit: 2021-12-06 | Discharge: 2021-12-06 | Disposition: A | Discharge status: Home / Self Care | Payer: Self-pay | Source: Ambulatory Visit | Attending: Cardiology | Admitting: Cardiology

## 2021-12-06 ENCOUNTER — Other Ambulatory Visit: Payer: Self-pay

## 2021-12-06 DIAGNOSIS — R002 Palpitations: Secondary | ICD-10-CM | POA: Insufficient documentation

## 2021-12-06 DIAGNOSIS — Z8249 Family history of ischemic heart disease and other diseases of the circulatory system: Secondary | ICD-10-CM | POA: Insufficient documentation

## 2021-12-13 ENCOUNTER — Other Ambulatory Visit: Payer: Self-pay

## 2021-12-13 ENCOUNTER — Other Ambulatory Visit (HOSPITAL_COMMUNITY)
Admission: RE | Admit: 2021-12-13 | Discharge: 2021-12-13 | Disposition: A | Discharge status: Home / Self Care | Payer: No Typology Code available for payment source | Source: Ambulatory Visit | Attending: Adult Health | Admitting: Adult Health

## 2021-12-13 ENCOUNTER — Encounter: Payer: Self-pay | Admitting: Adult Health

## 2021-12-13 ENCOUNTER — Ambulatory Visit (INDEPENDENT_AMBULATORY_CARE_PROVIDER_SITE_OTHER): Payer: No Typology Code available for payment source | Admitting: Adult Health

## 2021-12-13 VITALS — BP 134/83 | HR 75 | Ht 61.0 in | Wt 316.8 lb

## 2021-12-13 DIAGNOSIS — Z01419 Encounter for gynecological examination (general) (routine) without abnormal findings: Secondary | ICD-10-CM | POA: Diagnosis present

## 2021-12-13 DIAGNOSIS — Z1159 Encounter for screening for other viral diseases: Secondary | ICD-10-CM

## 2021-12-13 DIAGNOSIS — L292 Pruritus vulvae: Secondary | ICD-10-CM

## 2021-12-13 DIAGNOSIS — Z1211 Encounter for screening for malignant neoplasm of colon: Secondary | ICD-10-CM | POA: Diagnosis not present

## 2021-12-13 DIAGNOSIS — N644 Mastodynia: Secondary | ICD-10-CM

## 2021-12-13 DIAGNOSIS — R519 Headache, unspecified: Secondary | ICD-10-CM

## 2021-12-13 DIAGNOSIS — Z113 Encounter for screening for infections with a predominantly sexual mode of transmission: Secondary | ICD-10-CM

## 2021-12-13 LAB — HEMOCCULT GUIAC POC 1CARD (OFFICE): Fecal Occult Blood, POC: NEGATIVE

## 2021-12-13 MED ORDER — TERCONAZOLE 0.4 % VA CREA: 1.0000 | TOPICAL_CREAM | Freq: Every day | VAGINAL | 1 refills | Status: DC

## 2021-12-13 MED ORDER — NORETHINDRONE 0.35 MG PO TABS: 1.0000 | ORAL_TABLET | Freq: Every day | ORAL | 11 refills | Status: DC

## 2021-12-13 NOTE — Progress Notes (Signed)
Patient ID: Annette Mitchell, female   DOB: 13-Nov-1981, 41 y.o.   MRN: [id number] History of Present Illness: Annette Mitchell is a 42 year old white female,married, [id number], in for a well woman gyn exam and pap. She is having some vulva itching, pain in left breast and skin color change for about a month, and is moody and has headaches before period. She sees Annette Overly PA in [place] and is on Seroquel.  PCP is Annette Mitchell.    Current Medications, Allergies, Past Medical History, Past Surgical History, Family History and Social History were reviewed in [name] record.     Review of Systems: Patient denies any  hearing loss, fatigue, blurred vision, shortness of breath, chest pain, abdominal pain, problems with bowel movements, urination, or intercourse. No joint pain. See HPI for positives.     Physical Exam:BP 134/83 (BP Location: Left Arm, Patient Position: Sitting, Cuff Size: Large)    Pulse 75    Ht 5\' 1"  (1.549 m)    Wt (!) 316 lb 12.8 oz (143.7 kg)    LMP 12/06/2021 (Approximate)    BMI 59.86 kg/m   General:  Well developed, well nourished, no acute distress Skin:  Warm and dry Neck:  Midline trachea, normal thyroid, good ROM, no lymphadenopathy Lungs; Clear to auscultation bilaterally Breast:  No dominant palpable mass, retraction, or nipple discharge. On the left there is redness and skin texture changes near areola at 7-9 o'clock, and is tender. Cardiovascular: Regular rate and rhythm Abdomen:  Soft, non tender, no hepatosplenomegaly,obese Pelvic:  External genitalia is normal in appearance, no lesions, but slightly red.  The vagina is normal in appearance. Urethra has no lesions or masses. The cervix is bulbous. Pap with GC/CHL and HR HPV genotyping performed.  Uterus is felt to be normal size, shape, and contour.  No adnexal masses or tenderness noted.Bladder is non tender, no masses felt. Rectal: Good sphincter tone, no polyps, or hemorrhoids felt.  Hemoccult  negative. Extremities/musculoskeletal:  No swelling or varicosities noted, no clubbing or cyanosis Psych:  No Mitchell changes, alert and cooperative,seems happy AA is 1 Fall risk is low Depression screen PHQ 2/9 12/13/2021  Decreased Interest 3  Down, Depressed, Hopeless 3  PHQ - 2 Score 6  Altered sleeping 3  Tired, decreased energy 3  Change in appetite 3  Feeling bad or failure about yourself  3  Trouble concentrating 3  Moving slowly or fidgety/restless 0  Suicidal thoughts 0  PHQ-9 Score 21   On Seroquel and sees Annette Overly PA GAD 7 : Generalized Anxiety Score 12/13/2021  Nervous, Anxious, on Edge 1  Control/stop worrying 1  Worry too much - different things 2  Trouble relaxing 1  Restless 0  Easily annoyed or irritable 2  Afraid - awful might happen 0  Total GAD 7 Score 7      Upstream - 12/13/21 1335       Pregnancy Intention Screening   Does the patient want to become pregnant in the next year? No    Does the patient's partner want to become pregnant in the next year? No    Would the patient like to discuss contraceptive options today? No      Contraception Wrap Up   Current Method Female Sterilization    End Method Female Sterilization    Contraception Counseling Provided No             Examination chaperoned by Annette Mood LPN  Impression and Plan:  1. Encounter for gynecological examination with Papanicolaou smear of cervix Pap sent Physical with PCP Pap in 3 years if normal  - Cytology - PAP( [name])  2. Encounter for screening fecal occult blood testing - POCT occult blood stool  3. Vulvar itching Will rx Terazol 7 cream Meds ordered this encounter  Medications   terconazole (TERAZOL 7) 0.4 % vaginal cream    Sig: Place 1 applicator vaginally at bedtime.    Dispense:  45 g    Refill:  1    Order Specific Question:   Supervising Provider    Answer:   Tania Ade H [[id number]]   norethindrone (MICRONOR) 0.35 MG tablet    Sig: Take 1 tablet  (0.35 mg total) by mouth daily.    Dispense:  28 tablet    Refill:  11    Order Specific Question:   Supervising Provider    Answer:   Elonda Husky, LUTHER H [[id number]]     4. Morbid obesity ([place])  5. Breast pain, left Diagnostic mammogram and [place] scheduled at [facility] 12/27/21 at 3:40 pm  - US BREAST LTD UNI RIGHT INC AXILLA; Future - MM DIAG BREAST TOMO BILATERAL; Future - US BREAST LTD UNI LEFT INC AXILLA; Future  6. Need for hepatitis C screening test Needs screening x 1  - Hepatitis C antibody  7. Screening examination for STD (sexually transmitted disease) She requests HIV and RPR  - HIV Antibody (routine testing w rflx) - RPR  8. Frequent headaches, before period Will try Micronor and let me know if helps with headaches and moods, in about 8-10 weeks, can call

## 2021-12-14 ENCOUNTER — Encounter: Payer: Self-pay | Admitting: Physician Assistant

## 2021-12-14 ENCOUNTER — Telehealth: Payer: Self-pay | Admitting: Physician Assistant

## 2021-12-14 ENCOUNTER — Other Ambulatory Visit: Payer: Self-pay

## 2021-12-14 ENCOUNTER — Ambulatory Visit (INDEPENDENT_AMBULATORY_CARE_PROVIDER_SITE_OTHER): Payer: No Typology Code available for payment source | Admitting: Physician Assistant

## 2021-12-14 DIAGNOSIS — F32A Depression, unspecified: Secondary | ICD-10-CM | POA: Diagnosis not present

## 2021-12-14 DIAGNOSIS — R5383 Other fatigue: Secondary | ICD-10-CM | POA: Diagnosis not present

## 2021-12-14 DIAGNOSIS — F3132 Bipolar disorder, current episode depressed, moderate: Secondary | ICD-10-CM | POA: Diagnosis not present

## 2021-12-14 DIAGNOSIS — F900 Attention-deficit hyperactivity disorder, predominantly inattentive type: Secondary | ICD-10-CM

## 2021-12-14 LAB — HIV ANTIBODY (ROUTINE TESTING W REFLEX): HIV Screen 4th Generation wRfx: NONREACTIVE

## 2021-12-14 LAB — RPR: RPR Ser Ql: NONREACTIVE

## 2021-12-14 LAB — HEPATITIS C ANTIBODY: Hep C Virus Ab: <0.1 s/co ratio (ref 0.0–0.9)

## 2021-12-14 MED ORDER — QUETIAPINE FUMARATE 400 MG PO TABS: 400.0000 mg | ORAL_TABLET | Freq: Every day | ORAL | 1 refills | Status: DC

## 2021-12-14 MED ORDER — FLUOXETINE HCL 20 MG PO CAPS
20.0000 mg | ORAL_CAPSULE | Freq: Every day | ORAL | 1 refills | Status: DC
Start: 2021-12-14 — End: 2023-02-02

## 2021-12-14 MED ORDER — VILAZODONE HCL 20 MG PO TABS: ORAL_TABLET | ORAL | 1 refills | Status: DC

## 2021-12-14 NOTE — Progress Notes (Signed)
[facility]  Patient ID: Annette Mitchell,  MRN: [id number]  PCP: Sharilyn Sites, MD  Date of Evaluation: 12/14/2021 Time spent:25 minutes  Chief Complaint:  Chief Complaint   ADD; Depression; Follow-up    Virtual Visit via Telehealth  I connected with patient by telephone, with their informed consent, and verified patient privacy and that I am speaking with the correct person using two identifiers.  I am private, in my office and the patient is in [place], in her car.  I discussed the limitations, risks, security and privacy concerns of performing an evaluation and management service by telephone and the availability of in person appointments. I also discussed with the patient that there may be a patient responsible charge related to this service. The patient expressed understanding and agreed to proceed.   I discussed the assessment and treatment plan with the patient. The patient was provided an opportunity to ask questions and all were answered. The patient agreed with the plan and demonstrated an understanding of the instructions.   The patient was advised to call back or seek an in-person evaluation if the symptoms worsen or if the condition fails to improve as anticipated.  I provided 25 minutes of non-face-to-face time during this encounter.  HISTORY/CURRENT STATUS: HPI  for routine med check. Not doing well.  Depressed for 2 weeks now. No trigger. "In a funk that won't go away.  Usually when I feel like this, it goes away in 2 or 3 days."  Feels sad, hopeless, not enjoying things, has very low energy and motivation.  She has not been on a stimulant for maybe 3 or 4 months due to the shortage of Adderall but then Ritalin did not help at all.  So that has affected her energy and ability to stay up all day.  She sleeps at night and then naps during the day at times now.  Never feels rested.  ADLs and personal hygiene are normal.  States she is really OCD about that.   Isolates.  Not crying easily.  Does not work outside the home.  No suicidal or homicidal thoughts.  Patient denies increased energy with decreased need for sleep, no increased talkativeness, no racing thoughts, no impulsivity or risky behaviors, no increased spending, no increased libido, no grandiosity, no paranoia, no hallucinations.  Denies dizziness, syncope, seizures, numbness, tingling, tremor, tics, unsteady gait, slurred speech, confusion. Denies muscle or joint pain, stiffness, or dystonia.  Individual Medical History/ Review of Systems: Changes? :No    Past medications for mental health diagnoses include: Cymbalta, Prozac, Zoloft, Celexa, Paxil, Lexapro, Pristiq caused paranoia,  Wellbutrin, lithium, Seroquel, Latuda, Lamictal, Vraylar caused migraines, Lunesta, Sonata, Restoril, Rexulti, Abilify, Ambien  Allergies: Latex, Betadine [povidone iodine], and Pristiq [desvenlafaxine]  Current Medications:  Current Outpatient Medications:    EPINEPHrine 0.3 mg/0.3 mL IJ SOAJ injection, Inject into the muscle., Disp: , Rfl:    meloxicam (MOBIC) 15 MG tablet, Take 15 mg by mouth daily., Disp: , Rfl:    metoprolol tartrate (LOPRESSOR) 50 MG tablet, Take 1 tablet (50 mg total) by mouth at bedtime., Disp: 90 tablet, Rfl: 3   norethindrone (MICRONOR) 0.35 MG tablet, Take 1 tablet (0.35 mg total) by mouth daily., Disp: 28 tablet, Rfl: 11   ondansetron (ZOFRAN-ODT) 4 MG disintegrating tablet, Take by mouth., Disp: , Rfl:    PROAIR HFA 108 (90 BASE) MCG/ACT inhaler, INHALE 2 PUFFS EVERY 4 TO 6 HOURS IF NEEDED, Disp: , Rfl: 3   SUMAtriptan (IMITREX)  100 MG tablet, Take 100 mg by mouth daily as needed., Disp: , Rfl:    terbinafine (LAMISIL) 250 MG tablet, Take 500 mg by mouth daily., Disp: , Rfl:    terconazole (TERAZOL 7) 0.4 % vaginal cream, Place 1 applicator vaginally at bedtime., Disp: 45 g, Rfl: 1   Vilazodone HCl 20 MG TABS, 1/2 po q a.m. for 1 week, then 1 po q a.m., Disp: 30 tablet, Rfl:  1   methylphenidate (RITALIN) 10 MG tablet, Take 1 tablet (10 mg total) by mouth 2 (two) times daily. (Patient not taking: Reported on 12/13/2021), Disp: 60 tablet, Rfl: 0   QUEtiapine (SEROQUEL) 400 MG tablet, Take 1 tablet (400 mg total) by mouth at bedtime., Disp: 90 tablet, Rfl: 1 Medication Side Effects: none  Family Medical/ Social History: Changes?  None  MENTAL HEALTH EXAM:  Last menstrual period 12/06/2021.There is no height or weight on file to calculate BMI.  General Appearance:  Unable to assess  Eye Contact:   Unable to assess  Speech:  Clear and Coherent  Volume:  Normal  Mood:  Depressed and Hopeless  Affect:   Unable to assess  Thought Process:  Goal Directed and Descriptions of Associations: Circumstantial  Orientation:  Full (Time, Place, and Person)  Thought Content: Logical   Suicidal Thoughts:  No  Homicidal Thoughts:  No  Memory:  WNL  Judgement:  Good  Insight:  Good  Psychomotor Activity:   Unable to assess  Concentration:  Concentration: Poor and Attention Span: Poor  Recall:  Good  Fund of Knowledge: Good  Language: Good  Assets:  Desire for Improvement  ADL's:  Intact  Cognition: WNL  Prognosis:  Good   Labs 06/10/2021 CBC, CMP, lipase reviewed, on chart.  No recent lipid panel  DIAGNOSES:    ICD-10-CM   1. Bipolar disorder with moderate depression ([place])  F31.32     2. Attention deficit hyperactivity disorder (ADHD), predominantly inattentive type  F90.0     3. Fatigue due to depression  F32.A    R53.83        Receiving Psychotherapy: No    RECOMMENDATIONS:  PDMP was reviewed. Ritalin filled 08/03/21.  I provided 25 minutes of non-face-to-face time during this encounter, including time spent before and after the visit in records review, medical decision making, counseling pertinent to today's visit, and charting.  Discussed options for depression.  She has taken a higher dose of Seroquel in the past which helped at the time but did cause  her to be much drowsier and she is concerned that if we increase that and she does not have a stimulant to counteract that side effect it will make her feel worse overall. She has taken many different antidepressants over the years.  She has never taken Viibryd or Trintellix.  Recommend starting Viibryd.  Benefits, risks, side effects were discussed and she accepts and would like to try that. Also discussed a stimulant.  Due to the [employer] of Adderall, we will need to try another stimulant since the Ritalin has not been effective at all.  We could increase the dose of that but she had I will discuss at the next visit.  I do not want to start to do drugs at the same time.  She understands. I see no recent lipid panel.  Last CMP was 6 months ago.  Will need to order hemoglobin A1c, BMP, lipid panel at next visit. Start Viibryd 20 mg, 1/2 pill p.o. daily for 1  week, then 1 p.o. daily. Continue Seroquel 400 mg, 1 p.o. nightly. Return in 4 weeks, in office, has not been seen face to face in over 2 years.  Donnal Moat, PA-C

## 2021-12-14 NOTE — Telephone Encounter (Signed)
Pt informed rx sent.

## 2021-12-14 NOTE — Telephone Encounter (Signed)
Yes, it's ok to re-try Prozac. Start at 20 mg, 1 daily. #30 w/ ! RF. Please send it in. Thanks.

## 2021-12-14 NOTE — Telephone Encounter (Signed)
Pt called back asking to try Prozac. Had good results in the past. Contact # [phone]

## 2021-12-14 NOTE — Telephone Encounter (Signed)
Patient called in stating that medication Vilazodone prescribed today at appt is $40 and she can't pay that for something that might not work. Could she try something else that's cheaper or possibly be prescribed prescription for a three month supply. Please call to discuss Ph: [phone]

## 2021-12-14 NOTE — Telephone Encounter (Signed)
Please review

## 2021-12-16 LAB — CYTOLOGY - PAP
Adequacy: ABSENT
Chlamydia: NEGATIVE
Comment: NEGATIVE
Comment: NEGATIVE
Comment: NORMAL
Diagnosis: UNDETERMINED — AB
High risk HPV: NEGATIVE
Neisseria Gonorrhea: NEGATIVE

## 2021-12-23 ENCOUNTER — Encounter: Payer: Self-pay | Admitting: Adult Health

## 2021-12-23 DIAGNOSIS — R87610 Atypical squamous cells of undetermined significance on cytologic smear of cervix (ASC-US): Secondary | ICD-10-CM

## 2021-12-23 HISTORY — DX: Atypical squamous cells of undetermined significance on cytologic smear of cervix (ASC-US): R87.610

## 2021-12-27 ENCOUNTER — Ambulatory Visit (HOSPITAL_COMMUNITY)
Admission: RE | Admit: 2021-12-27 | Discharge: 2021-12-27 | Disposition: A | Discharge status: Home / Self Care | Payer: No Typology Code available for payment source | Source: Ambulatory Visit | Attending: Adult Health | Admitting: Adult Health

## 2021-12-27 ENCOUNTER — Other Ambulatory Visit: Payer: Self-pay

## 2021-12-27 DIAGNOSIS — N644 Mastodynia: Secondary | ICD-10-CM

## 2022-01-02 ENCOUNTER — Telehealth: Payer: Self-pay | Admitting: Cardiology

## 2022-01-02 MED ORDER — METOPROLOL TARTRATE 50 MG PO TABS: 50.0000 mg | ORAL_TABLET | Freq: Every day | ORAL | 1 refills | Status: DC

## 2022-01-02 NOTE — Telephone Encounter (Signed)
°*  STAT* If patient is at the pharmacy, call can be transferred to refill team.   1. Which medications need to be refilled? (please list name of each medication and dose if known) metoprolol tartrate (LOPRESSOR) 50 MG tablet  2. Which pharmacy/location (including street and city if local pharmacy) is medication to be sent to? [id number] - [place], [place] - [address]  3. Do they need a 30 day or 90 day supply? 90

## 2022-01-02 NOTE — Telephone Encounter (Signed)
Done

## 2022-01-02 NOTE — Telephone Encounter (Signed)
This is a [place] pt.  °

## 2022-02-07 ENCOUNTER — Ambulatory Visit: Payer: No Typology Code available for payment source | Admitting: Cardiology

## 2022-07-06 ENCOUNTER — Other Ambulatory Visit: Payer: Self-pay | Admitting: Cardiology

## 2023-01-17 ENCOUNTER — Ambulatory Visit: Payer: No Typology Code available for payment source | Admitting: Physician Assistant

## 2023-01-20 ENCOUNTER — Other Ambulatory Visit: Payer: Self-pay | Admitting: Physician Assistant

## 2023-01-26 ENCOUNTER — Ambulatory Visit: Payer: No Typology Code available for payment source | Admitting: Physician Assistant

## 2023-02-02 ENCOUNTER — Ambulatory Visit (INDEPENDENT_AMBULATORY_CARE_PROVIDER_SITE_OTHER): Payer: No Typology Code available for payment source | Admitting: Physician Assistant

## 2023-02-02 ENCOUNTER — Telehealth: Payer: Self-pay | Admitting: Physician Assistant

## 2023-02-02 ENCOUNTER — Encounter: Payer: Self-pay | Admitting: Physician Assistant

## 2023-02-02 DIAGNOSIS — F900 Attention-deficit hyperactivity disorder, predominantly inattentive type: Secondary | ICD-10-CM

## 2023-02-02 DIAGNOSIS — F3132 Bipolar disorder, current episode depressed, moderate: Secondary | ICD-10-CM

## 2023-02-02 DIAGNOSIS — F4321 Adjustment disorder with depressed mood: Secondary | ICD-10-CM

## 2023-02-02 MED ORDER — QUETIAPINE FUMARATE 200 MG PO TABS: 600.0000 mg | ORAL_TABLET | Freq: Every day | ORAL | 0 refills | Status: DC

## 2023-02-02 MED ORDER — QUETIAPINE FUMARATE 300 MG PO TABS: 600.0000 mg | ORAL_TABLET | Freq: Every day | ORAL | 1 refills | Status: DC

## 2023-02-02 NOTE — Telephone Encounter (Signed)
Please let her know I'm not giving her a 3 month supply. We need to see how she does on this dose before I'll do that. Ok to send Rx for 200 mg, #90, 3 po qhs.  For the record, she hasn't been compliant with appointments, 1 year overdue. Also, I want lab results before I'll give her 90d supply of Seroquel. (She's supposed to get results sent to me.)

## 2023-02-02 NOTE — Progress Notes (Unsigned)
[facility]  Patient ID: Annette Mitchell,  MRN: [name]  PCP: Sharilyn Sites, MD  Date of Evaluation: 02/02/2023 Time spent:30 minutes  Chief Complaint:  Chief Complaint   Depression; Insomnia    Virtual Visit via Telehealth  I connected with patient by telephone, with their informed consent, and verified patient privacy and that I am speaking with the correct person using two identifiers.  I am private, in my office and the patient is in [place], at her cousin's house.  I discussed the limitations, risks, security and privacy concerns of performing an evaluation and management service by telephone and the availability of in person appointments. I also discussed with the patient that there may be a patient responsible charge related to this service. The patient expressed understanding and agreed to proceed.   I discussed the assessment and treatment plan with the patient. The patient was provided an opportunity to ask questions and all were answered. The patient agreed with the plan and demonstrated an understanding of the instructions.   The patient was advised to call back or seek an in-person evaluation if the symptoms worsen or if the condition fails to improve as anticipated.  I provided 30 minutes of non-face-to-face time during this encounter.  HISTORY/CURRENT STATUS: HPI  Not doing well.  1 year overdue for appointment.  Annette Mitchell states that she has been more depressed for the past month.  Does not want to do anything.  Has very low energy and motivation.  She is not able to sleep well, she wakes up several times during the night and has a hard time going back to sleep.  She does not work outside the home.  ADLs and personal hygiene are normal.  Appetite is normal for her and weight is stable.  Feels hopeless at times.  Cries easily.  No extreme anxiety.  Has not been taking a stimulant, had a hard time finding Adderall so she has not taken anything for a long  while.  No complaints anxiety.  No suicidal or homicidal thoughts.  Has had a lot of stress in the past year.  Her father died and then one of her grandmothers passed away.  Her daughter has been diagnosed with daily headaches.  Patient states she is in and out of the doctor's office all the time.  Patient denies increased energy with decreased need for sleep, increased talkativeness, racing thoughts, impulsivity or risky behaviors, increased spending, increased libido, grandiosity, increased irritability or anger, paranoia, or hallucinations.  Denies dizziness, syncope, seizures, numbness, tingling, tremor, tics, unsteady gait, slurred speech, confusion. Denies muscle or joint pain, stiffness, or dystonia. Denies unexplained weight loss, frequent infections, or sores that heal slowly.  No polyphagia, polydipsia, or polyuria. Denies visual changes or paresthesias.   Individual Medical History/ Review of Systems: Changes? :No    Past medications for mental health diagnoses include: Cymbalta, Prozac, Zoloft, Celexa, Paxil, Lexapro, Pristiq caused paranoia,  Wellbutrin, lithium, Seroquel, Latuda, Lamictal, Vraylar caused migraines, Lunesta, Sonata, Restoril, Rexulti, Abilify, Ambien  Allergies: Latex, Betadine [povidone iodine], and Pristiq [desvenlafaxine]  Current Medications:  Current Outpatient Medications:    meloxicam (MOBIC) 15 MG tablet, Take 15 mg by mouth daily., Disp: , Rfl:    metoprolol tartrate (LOPRESSOR) 50 MG tablet, TAKE 1 TABLET BY MOUTH EVERYDAY AT BEDTIME, Disp: 90 tablet, Rfl: 1   ondansetron (ZOFRAN-ODT) 4 MG disintegrating tablet, Take by mouth., Disp: , Rfl:    PROAIR HFA 108 (90 BASE) MCG/ACT inhaler, INHALE 2 PUFFS EVERY  4 TO 6 HOURS IF NEEDED, Disp: , Rfl: 3   SUMAtriptan (IMITREX) 100 MG tablet, Take 100 mg by mouth daily as needed., Disp: , Rfl:    EPINEPHrine 0.3 mg/0.3 mL IJ SOAJ injection, Inject into the muscle. (Patient not taking: Reported on 02/02/2023), Disp: ,  Rfl:    norethindrone (MICRONOR) 0.35 MG tablet, Take 1 tablet (0.35 mg total) by mouth daily. (Patient not taking: Reported on 02/02/2023), Disp: 28 tablet, Rfl: 11   QUEtiapine (SEROQUEL) 200 MG tablet, Take 3 tablets (600 mg total) by mouth at bedtime., Disp: 90 tablet, Rfl: 0 Medication Side Effects: none  Family Medical/ Social History: Changes?  None  MENTAL HEALTH EXAM:  There were no vitals taken for this visit.There is no height or weight on file to calculate BMI.  General Appearance:  Unable to assess  Eye Contact:   Unable to assess  Speech:  Clear and Coherent  Volume:  Normal  Mood:   sad  Affect:   Unable to assess  Thought Process:  Goal Directed and Descriptions of Associations: Circumstantial  Orientation:  Full (Time, Place, and Person)  Thought Content: Logical   Suicidal Thoughts:  No  Homicidal Thoughts:  No  Memory:  WNL  Judgement:  Good  Insight:  Good  Psychomotor Activity:   Unable to assess  Concentration:  Concentration: Poor and Attention Span: Poor  Recall:  Good  Fund of Knowledge: Good  Language: Good  Assets:  Desire for Improvement  ADL's:  Intact  Cognition: WNL  Prognosis:  Good   DIAGNOSES:    ICD-10-CM   1. Bipolar disorder with moderate depression ([place])  F31.32     2. Attention deficit hyperactivity disorder (ADHD), predominantly inattentive type  F90.0     3. Grief  F43.21      Receiving Psychotherapy: No   RECOMMENDATIONS:  PDMP was reviewed.  Phentermine filled 11/13/2022 I provided 30 minutes of non-face-to-face time during this encounter, including time spent before and after the visit in records review, medical decision making, counseling pertinent to today's visit, and charting.   My condolences and the loss of her father. We had a long discussion about her symptoms.  Recommend increasing Seroquel.  Reminded her she has to keep appointments or else I will not continue to prescribe medications.  (After the appointment she  called back stating she needed a 90-day supply.  This was denied, she has a history of not keeping appointments so I will limit the quantity of her medications until I know she is compliant with medications, labs if needed, and appointment is for follow-up.) She is supposed to get results of recent labs to me prior to our next visit.  Increase Seroquel to 200 mg to 3 p.o. daily. Recommend multivitamin, B complex, fish oil, and vitamin D. Recommend therapy. Return in 4 to 6 weeks, must be in the office.   Donnal Moat, PA-C

## 2023-02-02 NOTE — Telephone Encounter (Signed)
Patient called after appt regarding Seroquel prescription. States that insurance requires prescription to be a 90 day supply and that she needs it written for 200mg  instead of 600mg  for three tablets a day. Ph: [facility]

## 2023-02-03 ENCOUNTER — Other Ambulatory Visit: Payer: Self-pay | Admitting: Physician Assistant

## 2023-02-07 NOTE — Telephone Encounter (Signed)
Patient notified

## 2023-02-21 ENCOUNTER — Telehealth: Payer: Self-pay | Admitting: Physician Assistant

## 2023-02-21 NOTE — Telephone Encounter (Signed)
Please see message from patient

## 2023-02-21 NOTE — Telephone Encounter (Signed)
Decrease Seroquel 200 mg to 2 po qhs. Stay on that until her appt in 3 weeks.

## 2023-02-21 NOTE — Telephone Encounter (Signed)
She called at 1pm. Since increase of Seroquel, she has had more heart racing issues. This is not helping and she wants  to come off of med completely.

## 2023-02-22 NOTE — Telephone Encounter (Signed)
Patient informed. She wanted an earlier appt because she doesn't want to be on the Seroquel. She said it makes her heart race and her doctor has put her on metoprolol to try to slow it down.

## 2023-02-22 NOTE — Telephone Encounter (Signed)
LVM to RC 

## 2023-02-26 ENCOUNTER — Ambulatory Visit (INDEPENDENT_AMBULATORY_CARE_PROVIDER_SITE_OTHER): Payer: No Typology Code available for payment source | Admitting: Physician Assistant

## 2023-02-26 ENCOUNTER — Encounter: Payer: Self-pay | Admitting: Physician Assistant

## 2023-02-26 ENCOUNTER — Other Ambulatory Visit: Payer: Self-pay | Admitting: Physician Assistant

## 2023-02-26 DIAGNOSIS — Z79899 Other long term (current) drug therapy: Secondary | ICD-10-CM

## 2023-02-26 DIAGNOSIS — F3132 Bipolar disorder, current episode depressed, moderate: Secondary | ICD-10-CM

## 2023-02-26 DIAGNOSIS — F429 Obsessive-compulsive disorder, unspecified: Secondary | ICD-10-CM

## 2023-02-26 MED ORDER — FLUOXETINE HCL 20 MG PO CAPS: 20.0000 mg | ORAL_CAPSULE | Freq: Every day | ORAL | 0 refills | Status: DC

## 2023-02-26 MED ORDER — LITHIUM CARBONATE 300 MG PO TABS: ORAL_TABLET | ORAL | 1 refills | Status: DC

## 2023-02-26 NOTE — Patient Instructions (Addendum)
Start Lithium 300 mg, one every evening for 3 nights, then increase to 2 pills every evening. Start Prozac 20 mg, 1 daily. Continue Seroquel 400 mg every night for now.  If you are having palpitations or your heart is racing worse than it is now then decreased to 300 mg.  You would take 1.5 pills of the 200 mg that you have. Have labs drawn in approximately 10 days from today.

## 2023-02-26 NOTE — Progress Notes (Signed)
[facility]  Patient ID: Annette Mitchell,  MRN: [id number]  PCP: Assunta Found, MD  Date of Evaluation: 02/26/2023 Time spent:30 minutes  Chief Complaint:  Chief Complaint   Depression    HISTORY/CURRENT STATUS: HPI  For f/u.   States the increase in Seroquel from OV 02/02/2023 made her feel worse.  She only took the 600 mg for a few nights, she had an increase in heart rate.  She has seen a cardiologist about that several years ago, and it has occurred off and on the entire time she has been on Seroquel but got a lot worse after the increase.  We decreased back to 400 mg, there was miscommunication she went to 200 mg for 1 night, had withdrawals so went back up to 400 mg.  She has stayed at that.  States she is really depressed.  She does not want to do much of anything.  Energy and motivation are very low.  ADLs and personal hygiene are decreased.  Appetite is the same, continues to gain weight.  Cries easily.  Does not work outside the home.  Denies suicidal or homicidal thoughts.  Patient denies increased energy with decreased need for sleep, increased talkativeness, racing thoughts, impulsivity or risky behaviors, increased spending, increased libido, grandiosity, increased irritability or anger, paranoia, or hallucinations.  Review of Systems  Constitutional:  Positive for malaise/fatigue.  HENT: Negative.    Eyes: Negative.   Respiratory: Negative.    Cardiovascular:  Positive for palpitations.  Gastrointestinal: Negative.   Genitourinary: Negative.   Musculoskeletal: Negative.   Skin: Negative.   Neurological: Negative.   Endo/Heme/Allergies: Negative.   Psychiatric/Behavioral:         See HPI   Individual Medical History/ Review of Systems: Changes? :No    Past medications for mental health diagnoses include: Cymbalta, Prozac, Zoloft, Celexa, Paxil, Lexapro, Pristiq caused paranoia,  Wellbutrin, lithium, Seroquel, Latuda, Lamictal, Vraylar caused  migraines, Lunesta, Sonata, Restoril, Rexulti, Abilify, Ambien  Allergies: Latex, Betadine [povidone iodine], and Pristiq [desvenlafaxine]  Current Medications:  Current Outpatient Medications:    diclofenac (CATAFLAM) 50 MG tablet, Take 50 mg by mouth 2 (two) times daily., Disp: , Rfl:    FLUoxetine (PROZAC) 20 MG capsule, Take 1 capsule (20 mg total) by mouth daily., Disp: 90 capsule, Rfl: 0   lithium 300 MG tablet, 1 po qhs for 3 nights, then 2 po qhs., Disp: 60 tablet, Rfl: 1   metoprolol tartrate (LOPRESSOR) 50 MG tablet, TAKE 1 TABLET BY MOUTH EVERYDAY AT BEDTIME, Disp: 90 tablet, Rfl: 1   ondansetron (ZOFRAN-ODT) 4 MG disintegrating tablet, Take by mouth., Disp: , Rfl:    PROAIR HFA 108 (90 BASE) MCG/ACT inhaler, INHALE 2 PUFFS EVERY 4 TO 6 HOURS IF NEEDED, Disp: , Rfl: 3   QUEtiapine (SEROQUEL) 200 MG tablet, Take 3 tablets (600 mg total) by mouth at bedtime. (Patient taking differently: Take 400 mg by mouth at bedtime.), Disp: 90 tablet, Rfl: 0   SUMAtriptan (IMITREX) 100 MG tablet, Take 100 mg by mouth daily as needed., Disp: , Rfl:    EPINEPHrine 0.3 mg/0.3 mL IJ SOAJ injection, Inject into the muscle. (Patient not taking: Reported on 02/02/2023), Disp: , Rfl:    meloxicam (MOBIC) 15 MG tablet, Take 15 mg by mouth daily. (Patient not taking: Reported on 02/26/2023), Disp: , Rfl:    norethindrone (MICRONOR) 0.35 MG tablet, Take 1 tablet (0.35 mg total) by mouth daily. (Patient not taking: Reported on 02/02/2023), Disp: 28 tablet, Rfl: 11  Medication Side Effects: none  Family Medical/ Social History: Changes?  None  MENTAL HEALTH EXAM:  There were no vitals taken for this visit.There is no height or weight on file to calculate BMI.  General Appearance: Casual, Well Groomed, and Obese  Eye Contact:  Good  Speech:  Clear and Coherent  Volume:  Normal  Mood:  Depressed  Affect:  Congruent  Thought Process:  Goal Directed and Descriptions of Associations: Circumstantial  Orientation:   Full (Time, Place, and Person)  Thought Content: Logical   Suicidal Thoughts:  No  Homicidal Thoughts:  No  Memory:  WNL  Judgement:  Good  Insight:  Good  Psychomotor Activity:  Normal  Concentration:  Concentration: Poor and Attention Span: Poor  Recall:  Good  Fund of Knowledge: Good  Language: Good  Assets:  Desire for Improvement  ADL's:  Intact  Cognition: WNL  Prognosis:  Good   DIAGNOSES:    ICD-10-CM   1. Bipolar disorder with moderate depression  F31.32 Lithium level    Comprehensive metabolic panel    TSH    CBC with Differential/Platelet    2. Obsessive-compulsive disorder, unspecified type  F42.9     3. Encounter for long-term (current) use of medications  Z79.899 Lithium level    Comprehensive metabolic panel    TSH    CBC with Differential/Platelet     Receiving Psychotherapy: No   RECOMMENDATIONS:  PDMP was reviewed.  Phentermine filled 12/03/2022 I provided 30 minutes of face to face time during this encounter, including time spent before and after the visit in records review, medical decision making, counseling pertinent to today's visit, and charting.   We had a long discussion about options for treatment.  She responded well to Prozac and lithium in the past and if appropriate would like to try 1 or both of those.  Both are appropriate.  I do not want to make more than 2 changes at 1 time so for now she will stay on the Seroquel at 400 mg but we will plan to wean off of it as soon as possible.  If the palpitations/tachycardia does not improve, or definitely gets worse, decreased Seroquel to 300 mg.  She understands.  I spent 20 minutes discussing the pros and cons of lithium including the possibility of toxicity, hypothyroidism, hypercalcemia, and decreased kidney function.  Labs will need to be monitored routinely due to the possibilities.  She verbalizes understanding.  Start Prozac 20 mg, 1 p.o. daily. Start lithium 300 mg, 1 p.o. nightly for 3  nights, then increase to 2 p.o. nightly. Continue Seroquel 400 mg nightly for now.  (See above) Labs ordered as noted above.  She will have drawn approximately 03/09/2023. Return in 4 weeks.  Melony Overly, PA-C

## 2023-03-07 ENCOUNTER — Ambulatory Visit: Payer: No Typology Code available for payment source | Admitting: Adult Health

## 2023-03-07 ENCOUNTER — Other Ambulatory Visit (HOSPITAL_COMMUNITY): Payer: Self-pay | Admitting: Adult Health

## 2023-03-07 DIAGNOSIS — Z1231 Encounter for screening mammogram for malignant neoplasm of breast: Secondary | ICD-10-CM

## 2023-03-14 ENCOUNTER — Ambulatory Visit (HOSPITAL_COMMUNITY): Payer: No Typology Code available for payment source

## 2023-03-16 ENCOUNTER — Encounter: Payer: Self-pay | Admitting: Physician Assistant

## 2023-03-16 ENCOUNTER — Ambulatory Visit (INDEPENDENT_AMBULATORY_CARE_PROVIDER_SITE_OTHER): Payer: Self-pay | Admitting: Physician Assistant

## 2023-03-16 DIAGNOSIS — Z91199 Patient's noncompliance with other medical treatment and regimen due to unspecified reason: Secondary | ICD-10-CM

## 2023-03-16 NOTE — Progress Notes (Signed)
No show

## 2023-03-17 LAB — CBC WITH DIFFERENTIAL/PLATELET
Basophils Absolute: 0 10*3/uL (ref 0.0–0.2)
Basos: 0 %
EOS (ABSOLUTE): 0.1 10*3/uL (ref 0.0–0.4)
Eos: 1 %
Hematocrit: 43.7 % (ref 34.0–46.6)
Hemoglobin: 14.1 g/dL (ref 11.1–15.9)
Immature Grans (Abs): 0 10*3/uL (ref 0.0–0.1)
Immature Granulocytes: 0 %
Lymphocytes Absolute: 2.2 10*3/uL (ref 0.7–3.1)
Lymphs: 24 %
MCH: 29.5 pg (ref 26.6–33.0)
MCHC: 32.3 g/dL (ref 31.5–35.7)
MCV: 91 fL (ref 79–97)
Monocytes Absolute: 0.6 10*3/uL (ref 0.1–0.9)
Monocytes: 7 %
Neutrophils Absolute: 6 10*3/uL (ref 1.4–7.0)
Neutrophils: 68 %
RBC: 4.78 x10E6/uL (ref 3.77–5.28)
RDW: 12.4 % (ref 11.7–15.4)
WBC: 8.9 10*3/uL (ref 3.4–10.8)

## 2023-03-17 LAB — COMPREHENSIVE METABOLIC PANEL
ALT: 19 IU/L (ref 0–32)
AST: 17 IU/L (ref 0–40)
Albumin/Globulin Ratio: 1.5 (ref 1.2–2.2)
Albumin: 4.1 g/dL (ref 3.9–4.9)
Alkaline Phosphatase: 105 IU/L (ref 44–121)
BUN/Creatinine Ratio: 9 (ref 9–23)
BUN: 8 mg/dL (ref 6–24)
Bilirubin Total: 0.3 mg/dL (ref 0.0–1.2)
CO2: 22 mmol/L (ref 20–29)
Calcium: 8.7 mg/dL (ref 8.7–10.2)
Chloride: 101 mmol/L (ref 96–106)
Creatinine, Ser: 0.89 mg/dL (ref 0.57–1.00)
Globulin, Total: 2.8 g/dL (ref 1.5–4.5)
Glucose: 100 mg/dL — ABNORMAL HIGH (ref 70–99)
Potassium: 4.4 mmol/L (ref 3.5–5.2)
Sodium: 139 mmol/L (ref 134–144)
Total Protein: 6.9 g/dL (ref 6.0–8.5)
eGFR: 82 mL/min/{1.73_m2} (ref >59)

## 2023-03-17 LAB — TSH: TSH: 4.15 u[IU]/mL (ref 0.450–4.500)

## 2023-03-17 LAB — LITHIUM LEVEL: Lithium Lvl: 0.4 mmol/L — ABNORMAL LOW (ref 0.5–1.2)

## 2023-03-19 ENCOUNTER — Other Ambulatory Visit: Payer: Self-pay | Admitting: Physician Assistant

## 2023-03-19 DIAGNOSIS — F3132 Bipolar disorder, current episode depressed, moderate: Secondary | ICD-10-CM

## 2023-03-19 DIAGNOSIS — Z79899 Other long term (current) drug therapy: Secondary | ICD-10-CM

## 2023-03-19 NOTE — Progress Notes (Signed)
Lithium level is lower than it needs to be.  Increase her dose to 900 mg (3 pills) nightly. Her kidney function is normal.  Her TSH is at the high end of normal, no treatment right now, I'll disc with her at appt next week. Repeat Li level in 5-7 days. I'll order the lab to print up front, please mail to her.  Thanks.

## 2023-03-27 ENCOUNTER — Ambulatory Visit: Payer: No Typology Code available for payment source | Admitting: Physician Assistant

## 2023-03-29 ENCOUNTER — Inpatient Hospital Stay (HOSPITAL_COMMUNITY): Admission: RE | Admit: 2023-03-29 | Payer: No Typology Code available for payment source | Source: Ambulatory Visit

## 2023-04-02 ENCOUNTER — Ambulatory Visit (HOSPITAL_COMMUNITY): Payer: No Typology Code available for payment source

## 2023-04-16 ENCOUNTER — Telehealth: Payer: Self-pay | Admitting: Physician Assistant

## 2023-04-16 MED ORDER — LITHIUM CARBONATE 300 MG PO TABS: ORAL_TABLET | ORAL | 1 refills | Status: DC

## 2023-04-16 NOTE — Telephone Encounter (Signed)
Dose increased to 900 mg, Rx sent.

## 2023-04-16 NOTE — Telephone Encounter (Signed)
Annette Mitchell called at 1:13 to report that she needs a new prescription for Lithium at the new dose.  Because you increased her dose she is out early and it is to soon to refill at old dose.  [facility] to [id number] - [place], [place] - [address]

## 2023-04-18 ENCOUNTER — Other Ambulatory Visit (HOSPITAL_COMMUNITY)
Admission: RE | Admit: 2023-04-18 | Discharge: 2023-04-18 | Disposition: A | Discharge status: Home / Self Care | Payer: No Typology Code available for payment source | Source: Ambulatory Visit | Attending: Adult Health | Admitting: Adult Health

## 2023-04-18 ENCOUNTER — Ambulatory Visit (INDEPENDENT_AMBULATORY_CARE_PROVIDER_SITE_OTHER): Payer: No Typology Code available for payment source | Admitting: Adult Health

## 2023-04-18 ENCOUNTER — Encounter: Payer: Self-pay | Admitting: Adult Health

## 2023-04-18 VITALS — BP 128/89 | HR 86 | Ht 61.0 in | Wt 308.0 lb

## 2023-04-18 DIAGNOSIS — Z01419 Encounter for gynecological examination (general) (routine) without abnormal findings: Secondary | ICD-10-CM | POA: Diagnosis not present

## 2023-04-18 DIAGNOSIS — N926 Irregular menstruation, unspecified: Secondary | ICD-10-CM | POA: Diagnosis not present

## 2023-04-18 DIAGNOSIS — R232 Flushing: Secondary | ICD-10-CM

## 2023-04-18 DIAGNOSIS — Z113 Encounter for screening for infections with a predominantly sexual mode of transmission: Secondary | ICD-10-CM | POA: Insufficient documentation

## 2023-04-18 DIAGNOSIS — Z3202 Encounter for pregnancy test, result negative: Secondary | ICD-10-CM

## 2023-04-18 DIAGNOSIS — Z1211 Encounter for screening for malignant neoplasm of colon: Secondary | ICD-10-CM

## 2023-04-18 DIAGNOSIS — R87610 Atypical squamous cells of undetermined significance on cytologic smear of cervix (ASC-US): Secondary | ICD-10-CM

## 2023-04-18 LAB — HEMOCCULT GUIAC POC 1CARD (OFFICE): Fecal Occult Blood, POC: NEGATIVE

## 2023-04-18 LAB — POCT URINE PREGNANCY: Preg Test, Ur: NEGATIVE

## 2023-04-18 MED ORDER — NORETHINDRONE ACETATE 5 MG PO TABS: 5.0000 mg | ORAL_TABLET | Freq: Every day | ORAL | 3 refills | Status: DC

## 2023-04-18 NOTE — Progress Notes (Signed)
Patient ID: Annette Mitchell, female   DOB: 07/25/1981, 42 y.o.   MRN: [id number] History of Present Illness: Annette Mitchell is a 42 year old white female,married, [id number] in for a well woman gyn exam. She is having irregular cycle, may have 2 periods a month and hot flashes.  Last pap was ASCUS, negative HPV 12/13/21.  PCP is Dr Phillips Odor.   Current Medications, Allergies, Past Medical History, Past Surgical History, Family History and Social History were reviewed in [name] record.     Review of Systems: Patient denies any  hearing loss, fatigue, blurred vision, shortness of breath, chest pain, abdominal pain, problems with bowel movements, urination, or intercourse. No joint pain or mood swings. She does have migraines with aura.  See HPI for positives.   Physical Exam:BP 128/89 (BP Location: Right Arm, Patient Position: Sitting, Cuff Size: Large)   Pulse 86   Ht 5\' 1"  (1.549 m)   Wt (!) 308 lb (139.7 kg)   LMP 04/09/2023 (Approximate)   BMI 58.20 kg/m  UPT is negative  General:  Well developed, well nourished, no acute distress Skin:  Warm and dry Neck:  Midline trachea, normal thyroid, good ROM, no lymphadenopathy Lungs; Clear to auscultation bilaterally Breast:  No dominant palpable mass, retraction, or nipple discharge Cardiovascular: Regular rate and rhythm Abdomen:  Soft, non tender, no hepatosplenomegaly Pelvic:  External genitalia is normal in appearance, no lesions.  The vagina is normal in appearance. Urethra has no lesions or masses. The cervix is bulbous.  Uterus is felt to be normal size, shape, and contour.  No adnexal masses or tenderness noted.Bladder is non tender, no masses felt. Rectal: Good sphincter tone, no polyps, or hemorrhoids felt.  Hemoccult negative. Extremities/musculoskeletal:  No swelling or varicosities noted, no clubbing or cyanosis Psych:  No mood changes, alert and cooperative,seems happy AA is 0 Fall risk is low    04/18/2023     3:50 PM 12/13/2021    1:30 PM  Depression screen PHQ 2/9  Decreased Interest 3 3  Down, Depressed, Hopeless 3 3  PHQ - 2 Score 6 6  Altered sleeping 3 3  Tired, decreased energy 3 3  Change in appetite 0 3  Feeling bad or failure about yourself  3 3  Trouble concentrating 3 3  Moving slowly or fidgety/restless 0 0  Suicidal thoughts 0 0  PHQ-9 Score 18 21   She is on meds and see Melony Overly     04/18/2023    3:50 PM 12/13/2021    1:30 PM  GAD 7 : Generalized Anxiety Score  Nervous, Anxious, on Edge 3 1  Control/stop worrying 3 1  Worry too much - different things 3 2  Trouble relaxing 3 1  Restless 3 0  Easily annoyed or irritable 3 2  Afraid - awful might happen 0 0  Total GAD 7 Score 18 7      Upstream - 04/18/23 1559       Pregnancy Intention Screening   Does the patient want to become pregnant in the next year? No    Does the patient's partner want to become pregnant in the next year? No    Would the patient like to discuss contraceptive options today? No      Contraception Wrap Up   Current Method Female Sterilization    End Method Female Sterilization    Contraception Counseling Provided No             Examination chaperoned  by Malachy Mood LPN  Impression and plan: 1. Pregnancy examination or test, negative result - POCT urine pregnancy  2. Irregular periods May have 2 a month Wil try aygestin 5 mg 1 daily  Meds ordered this encounter  Medications   norethindrone (AYGESTIN) 5 MG tablet    Sig: Take 1 tablet (5 mg total) by mouth daily.    Dispense:  30 tablet    Refill:  3    Order Specific Question:   Supervising Provider    Answer:   Despina Hidden, LUTHER H [[id number]]    3. Encounter for well woman exam with routine gynecological exam Physical in 1 year Pap in 2026   4. Encounter for screening fecal occult blood testing Hemoccult was negative   5. Hot flashes Will try aygestin 5 mg  Review handout on [facility] Let me know how it goes.  6.  Screening examination for STD (sexually transmitted disease) She requests STD testing CV swab sent for GC/CHL,trich,BV and yeast  - HIV Antibody (routine testing w rflx) - [facility] - Cervicovaginal ancillary only( [facility])  7. ASCUS of cervix with negative high risk HPV Pap in 2026

## 2023-04-19 LAB — LITHIUM LEVEL: Lithium Lvl: 0.6 mmol/L (ref 0.5–1.2)

## 2023-04-19 LAB — HIV ANTIBODY (ROUTINE TESTING W REFLEX): HIV Screen 4th Generation wRfx: NONREACTIVE

## 2023-04-19 LAB — RPR: RPR Ser Ql: NONREACTIVE

## 2023-04-19 NOTE — Progress Notes (Signed)
Lithium level is low normal range. No change at this time. She has missed a follow-up appointment already, please have her r/s in the next month so we can discuss her progress, dosing etc. Thanks.

## 2023-04-20 LAB — CERVICOVAGINAL ANCILLARY ONLY
Bacterial Vaginitis (gardnerella): NEGATIVE
Candida Glabrata: NEGATIVE
Candida Vaginitis: NEGATIVE
Chlamydia: NEGATIVE
Comment: NEGATIVE
Comment: NEGATIVE
Comment: NEGATIVE
Comment: NEGATIVE
Comment: NEGATIVE
Comment: NORMAL
Neisseria Gonorrhea: NEGATIVE
Trichomonas: NEGATIVE

## 2023-05-09 ENCOUNTER — Other Ambulatory Visit: Payer: Self-pay | Admitting: Physician Assistant

## 2023-05-25 ENCOUNTER — Other Ambulatory Visit: Payer: Self-pay | Admitting: Physician Assistant

## 2023-05-25 NOTE — Telephone Encounter (Signed)
Has appt 7/18, does she have enough to get her to appt

## 2023-05-28 NOTE — Telephone Encounter (Signed)
Patient said she didn't need a RF at this time.

## 2023-05-31 ENCOUNTER — Ambulatory Visit (INDEPENDENT_AMBULATORY_CARE_PROVIDER_SITE_OTHER): Payer: No Typology Code available for payment source | Admitting: Physician Assistant

## 2023-05-31 ENCOUNTER — Encounter: Payer: Self-pay | Admitting: Physician Assistant

## 2023-05-31 DIAGNOSIS — F3132 Bipolar disorder, current episode depressed, moderate: Secondary | ICD-10-CM | POA: Diagnosis not present

## 2023-05-31 DIAGNOSIS — Z79899 Other long term (current) drug therapy: Secondary | ICD-10-CM | POA: Diagnosis not present

## 2023-05-31 DIAGNOSIS — F900 Attention-deficit hyperactivity disorder, predominantly inattentive type: Secondary | ICD-10-CM

## 2023-05-31 DIAGNOSIS — F429 Obsessive-compulsive disorder, unspecified: Secondary | ICD-10-CM

## 2023-05-31 MED ORDER — LITHIUM CARBONATE 300 MG PO TABS: 1200.0000 mg | ORAL_TABLET | Freq: Every day | ORAL | 1 refills | Status: DC

## 2023-05-31 MED ORDER — QUETIAPINE FUMARATE 300 MG PO TABS: 600.0000 mg | ORAL_TABLET | Freq: Every day | ORAL | 1 refills | Status: DC

## 2023-05-31 NOTE — Progress Notes (Unsigned)
[facility]  Patient ID: Annette Mitchell,  MRN: [id number]  PCP: Assunta Found, MD  Date of Evaluation: 05/31/2023 Time spent:30 minutes  Chief Complaint:  Chief Complaint   Follow-up   Virtual Visit via Telehealth  I connected with patient by a video enabled telemedicine application with their informed consent, and verified patient privacy and that I am speaking with the correct person using two identifiers.  I am private, in my office and the patient is at home.  I discussed the limitations, risks, security and privacy concerns of performing an evaluation and management service by video and the availability of in person appointments. I also discussed with the patient that there may be a patient responsible charge related to this service. The patient expressed understanding and agreed to proceed.   I discussed the assessment and treatment plan with the patient. The patient was provided an opportunity to ask questions and all were answered. The patient agreed with the plan and demonstrated an understanding of the instructions.   The patient was advised to call back or seek an in-person evaluation if the symptoms worsen or if the condition fails to improve as anticipated.  I provided  30  minutes of non-face-to-face time during this encounter.   HISTORY/CURRENT STATUS: HPI  For f/u.   More depressed. Anhedonia. Tired and wants to sleep a lot.  Is sad and hopeless.  ADLs and personal hygiene are normal.   Denies any changes in concentration, making decisions, or remembering things.  Appetite has not changed.  Weight is stable.  No reports of anxiety. Denies suicidal or homicidal thoughts.  Patient denies increased energy with decreased need for sleep, increased talkativeness, racing thoughts, impulsivity or risky behaviors, increased spending, increased libido, grandiosity, increased irritability or anger, paranoia, or hallucinations.  Denies dizziness, syncope, seizures,  numbness, tingling, tremor, tics, unsteady gait, slurred speech, confusion. Denies muscle or joint pain, stiffness, or dystonia.Denies unexplained weight loss, frequent infections, or sores that heal slowly.  No polyphagia, polydipsia, or polyuria. Denies visual changes or paresthesias.   Individual Medical History/ Review of Systems: Changes? :No    Past medications for mental health diagnoses include: Cymbalta, Prozac, Zoloft, Celexa, Paxil, Lexapro, Pristiq caused paranoia,  Wellbutrin, lithium, Seroquel, Latuda, Lamictal, Vraylar caused migraines, Lunesta, Sonata, Restoril, Rexulti, Abilify, Ambien  Allergies: Latex, Betadine [povidone iodine], and Pristiq [desvenlafaxine]  Current Medications:  Current Outpatient Medications:    diclofenac (CATAFLAM) 50 MG tablet, Take 50 mg by mouth 2 (two) times daily., Disp: , Rfl:    FLUoxetine (PROZAC) 20 MG capsule, Take 1 capsule (20 mg total) by mouth daily., Disp: 90 capsule, Rfl: 0   meloxicam (MOBIC) 15 MG tablet, Take 15 mg by mouth daily., Disp: , Rfl:    metoprolol tartrate (LOPRESSOR) 50 MG tablet, TAKE 1 TABLET BY MOUTH EVERYDAY AT BEDTIME, Disp: 90 tablet, Rfl: 1   norethindrone (AYGESTIN) 5 MG tablet, Take 1 tablet (5 mg total) by mouth daily., Disp: 30 tablet, Rfl: 3   ondansetron (ZOFRAN-ODT) 4 MG disintegrating tablet, Take by mouth., Disp: , Rfl:    QUEtiapine (SEROQUEL) 300 MG tablet, Take 2 tablets (600 mg total) by mouth at bedtime., Disp: 60 tablet, Rfl: 1   SUMAtriptan (IMITREX) 100 MG tablet, Take 100 mg by mouth daily as needed., Disp: , Rfl:    EPINEPHrine 0.3 mg/0.3 mL IJ SOAJ injection, Inject into the muscle. (Patient not taking: Reported on 05/31/2023), Disp: , Rfl:    lithium 300 MG tablet, Take 4 tablets (  1,200 mg total) by mouth at bedtime., Disp: 120 tablet, Rfl: 1   PROAIR HFA 108 (90 BASE) MCG/ACT inhaler, INHALE 2 PUFFS EVERY 4 TO 6 HOURS IF NEEDED (Patient not taking: Reported on 05/31/2023), Disp: , Rfl:  3 Medication Side Effects: none  Family Medical/ Social History: Changes?  None  MENTAL HEALTH EXAM:  There were no vitals taken for this visit.There is no height or weight on file to calculate BMI.  General Appearance:  unable to assess  Eye Contact:   unable to assess  Speech:  Clear and Coherent  Volume:  Normal  Mood:  Depressed  Affect:   unable to assess  Thought Process:  Goal Directed and Descriptions of Associations: Circumstantial  Orientation:  Full (Time, Place, and Person)  Thought Content: Logical   Suicidal Thoughts:  No  Homicidal Thoughts:  No  Memory:  WNL  Judgement:  Good  Insight:  Good  Psychomotor Activity:   unable to assess  Concentration:  Concentration: Poor and Attention Span: Poor  Recall:  Good  Fund of Knowledge: Good  Language: Good  Assets:  Desire for Improvement  ADL's:  Intact  Cognition: WNL  Prognosis:  Good   DIAGNOSES:    ICD-10-CM   1. Bipolar disorder with moderate depression (HCC)  F31.32 Lithium level    2. Encounter for long-term (current) use of medications  Z79.899 Lithium level    3. Obsessive-compulsive disorder, unspecified type  F42.9     4. Attention deficit hyperactivity disorder (ADHD), predominantly inattentive type  F90.0      Receiving Psychotherapy: No   RECOMMENDATIONS:  PDMP was reviewed.  Phentermine filled 12/03/2022. I provided 30 minutes of non-face-to-face time during this encounter, including time spent before and after the visit in records review, medical decision making, counseling pertinent to today's visit, and charting.   Recommend increasing Li and seroquel. she agrees.  Cont  Prozac 20 mg, 1 p.o. daily. increase  lithium 300 mg,  4 at bedtime.  Increase Seroquel to 300 mg 2 at bedtime. Lithium level in 5 days. Recommend counseling. Return in 4 weeks.  Melony Overly, PA-C

## 2023-06-02 ENCOUNTER — Other Ambulatory Visit: Payer: Self-pay | Admitting: Physician Assistant

## 2023-06-03 ENCOUNTER — Encounter: Payer: Self-pay | Admitting: Physician Assistant

## 2023-06-04 ENCOUNTER — Telehealth: Payer: Self-pay | Admitting: Physician Assistant

## 2023-06-04 NOTE — Telephone Encounter (Signed)
Pt called at 11:15a requesting Seroquel script to be re-written.  It was sent in as 2 pills @ 300mg  per pill, but it should be 3 pills @ 200mg  per pill in order for her insurance to pay.  [facility] [phone] - [place], [place] - [address], [place] [address] Phone: [phone]  Fax: [phone]   No upcoming appt scheduled.

## 2023-06-05 NOTE — Telephone Encounter (Signed)
Patient wants Rx to be sent for 200 mg x3 due to cost. She said it costs her about $2, but if written for 300 mg x 2 it is $20.16.

## 2023-06-06 ENCOUNTER — Ambulatory Visit (HOSPITAL_COMMUNITY): Payer: No Typology Code available for payment source

## 2023-06-06 MED ORDER — QUETIAPINE FUMARATE 200 MG PO TABS
600.0000 mg | ORAL_TABLET | Freq: Every day | ORAL | 0 refills | Status: DC
Start: 2023-06-06 — End: 2023-07-01

## 2023-06-07 NOTE — Telephone Encounter (Signed)
Sent Rx for 3 200 mg tablets, likely to require PA for qty.

## 2023-06-15 NOTE — Progress Notes (Signed)
Li level is about the same after increasing the dose last week.  How is she feeling? If no better, increase dose to 5 pills (1,500 mg) and recheck level in 5 days. If better, ok to leave at 4 pills. She needs to set up an appt w/ me, which should have been sched for mid-August. Get in as soon as she can after that.

## 2023-06-24 ENCOUNTER — Other Ambulatory Visit: Payer: Self-pay | Admitting: Physician Assistant

## 2023-06-25 NOTE — Progress Notes (Signed)
Keep Lithium at 1,200 mg. Take with food in the evening, before be. The changes we made were 3 weeks ago, so it's too early to say they're not working.

## 2023-06-29 ENCOUNTER — Other Ambulatory Visit: Payer: Self-pay | Admitting: Physician Assistant

## 2023-07-24 ENCOUNTER — Ambulatory Visit: Payer: No Typology Code available for payment source | Admitting: Physician Assistant

## 2023-08-08 ENCOUNTER — Other Ambulatory Visit: Payer: Self-pay | Admitting: Physician Assistant

## 2023-08-08 NOTE — Telephone Encounter (Signed)
Has appt today

## 2023-08-09 ENCOUNTER — Ambulatory Visit (INDEPENDENT_AMBULATORY_CARE_PROVIDER_SITE_OTHER): Payer: No Typology Code available for payment source | Admitting: Physician Assistant

## 2023-08-09 ENCOUNTER — Encounter: Payer: Self-pay | Admitting: Physician Assistant

## 2023-08-09 DIAGNOSIS — F900 Attention-deficit hyperactivity disorder, predominantly inattentive type: Secondary | ICD-10-CM | POA: Diagnosis not present

## 2023-08-09 DIAGNOSIS — F319 Bipolar disorder, unspecified: Secondary | ICD-10-CM

## 2023-08-09 DIAGNOSIS — R5381 Other malaise: Secondary | ICD-10-CM | POA: Diagnosis not present

## 2023-08-09 DIAGNOSIS — R5383 Other fatigue: Secondary | ICD-10-CM | POA: Diagnosis not present

## 2023-08-09 MED ORDER — AMPHETAMINE-DEXTROAMPHETAMINE 20 MG PO TABS: 10.0000 mg | ORAL_TABLET | Freq: Two times a day (BID) | ORAL | 0 refills | Status: DC

## 2023-08-09 MED ORDER — FLUOXETINE HCL 20 MG PO CAPS: 20.0000 mg | ORAL_CAPSULE | Freq: Every day | ORAL | 1 refills | Status: DC

## 2023-08-09 NOTE — Progress Notes (Signed)
[facility]  Patient ID: Annette Mitchell,  MRN: [id number]  PCP: Assunta Found, MD  Date of Evaluation: 08/09/2023 Time spent:25 minutes  Chief Complaint:  Chief Complaint   Follow-up    HISTORY/CURRENT STATUS: HPI  For f/u. Accompanied by her dtr.  Couldn't tolerate the Li d/t severe n/v. She stopped it a few weeks ago. Feels that her mood is fine but she's really tired all the time, and doesn't want to do anything b/c of it. Doesn't work outside the home. No extreme sadness, tearfulness, or feelings of hopelessness.  Sleeps well most of the time. ADLs and personal hygiene are normal.  Appetite has not changed.  Weight is stable.  No c/o anxiety. Unless something triggers her and then she feels nervous. No PA. Denies suicidal or homicidal thoughts.  Hard time focusing on things. Can't stay on any one thing to finish tasks. Took Adderall in the past and it helped.   Patient denies increased energy with decreased need for sleep, increased talkativeness, racing thoughts, impulsivity or risky behaviors, increased spending, increased libido, grandiosity, increased irritability or anger, paranoia, or hallucinations.  Denies dizziness, syncope, seizures, numbness, tingling, tremor, tics, unsteady gait, slurred speech, confusion. Denies muscle or joint pain, stiffness, or dystonia. Denies unexplained weight loss, frequent infections, or sores that heal slowly.  No polyphagia, polydipsia, or polyuria. Denies visual changes or paresthesias.   Individual Medical History/ Review of Systems: Changes? :No    Past medications for mental health diagnoses include: Cymbalta, Prozac, Zoloft, Celexa, Paxil, Lexapro, Pristiq caused paranoia,  Wellbutrin, lithium, Seroquel, Latuda, Lamictal, Vraylar caused migraines, Lunesta, Sonata, Restoril, Rexulti, Abilify, Ambien, Lithium caused severe nausea  Allergies: Latex, Betadine [povidone iodine], and Pristiq [desvenlafaxine]  Current  Medications:  Current Outpatient Medications:    amphetamine-dextroamphetamine (ADDERALL) 20 MG tablet, Take 0.5 tablets (10 mg total) by mouth 2 (two) times daily with breakfast and lunch., Disp: 30 tablet, Rfl: 0   metoprolol tartrate (LOPRESSOR) 50 MG tablet, TAKE 1 TABLET BY MOUTH EVERYDAY AT BEDTIME, Disp: 90 tablet, Rfl: 1   ondansetron (ZOFRAN-ODT) 4 MG disintegrating tablet, Take by mouth., Disp: , Rfl:    QUEtiapine (SEROQUEL) 200 MG tablet, TAKE 3 TABLETS (600 MG TOTAL) BY MOUTH AT BEDTIME., Disp: 270 tablet, Rfl: 0   SUMAtriptan (IMITREX) 100 MG tablet, Take 100 mg by mouth daily as needed., Disp: , Rfl:    diclofenac (CATAFLAM) 50 MG tablet, Take 50 mg by mouth 2 (two) times daily. (Patient not taking: Reported on 08/09/2023), Disp: , Rfl:    EPINEPHrine 0.3 mg/0.3 mL IJ SOAJ injection, Inject into the muscle. (Patient not taking: Reported on 05/31/2023), Disp: , Rfl:    FLUoxetine (PROZAC) 20 MG capsule, Take 1 capsule (20 mg total) by mouth daily., Disp: 90 capsule, Rfl: 1   meloxicam (MOBIC) 15 MG tablet, Take 15 mg by mouth daily. (Patient not taking: Reported on 08/09/2023), Disp: , Rfl:    norethindrone (AYGESTIN) 5 MG tablet, Take 1 tablet (5 mg total) by mouth daily. (Patient not taking: Reported on 08/09/2023), Disp: 30 tablet, Rfl: 3   PROAIR HFA 108 (90 BASE) MCG/ACT inhaler, INHALE 2 PUFFS EVERY 4 TO 6 HOURS IF NEEDED (Patient not taking: Reported on 05/31/2023), Disp: , Rfl: 3 Medication Side Effects: none  Family Medical/ Social History: Changes?  None  MENTAL HEALTH EXAM:  There were no vitals taken for this visit.There is no height or weight on file to calculate BMI.  General Appearance: Casual, Well Groomed, and Obese  Eye Contact:  Good  Speech:  Clear and Coherent  Volume:  Normal  Mood:  Euthymic  Affect:  Congruent  Thought Process:  Goal Directed and Descriptions of Associations: Circumstantial  Orientation:  Full (Time, Place, and Person)  Thought Content:  Logical   Suicidal Thoughts:  No  Homicidal Thoughts:  No  Memory:  WNL  Judgement:  Good  Insight:  Good  Psychomotor Activity:  Normal  Concentration:  Concentration: Poor and Attention Span: Poor  Recall:  Good  Fund of Knowledge: Good  Language: Good  Assets:  Desire for Improvement  ADL's:  Intact  Cognition: WNL  Prognosis:  Good   DIAGNOSES:    ICD-10-CM   1. Bipolar I disorder (HCC)  F31.9     2. Attention deficit hyperactivity disorder (ADHD), predominantly inattentive type  F90.0     3. Malaise and fatigue  R53.81    R53.83      Receiving Psychotherapy: No   RECOMMENDATIONS:  PDMP was reviewed.  Phentermine filled 12/03/2022. I provided 25 minutes of face to face time during this encounter, including time spent before and after the visit in records review, medical decision making, counseling pertinent to today's visit, and charting.   Discussed her mood, she's stable on the Prozac and Seroquel so no changes there. Recommend restarting the Adderall to help w/ focus and energy.   Restart Adderall 20 mg, 1/2 po with breakfast and lunch.  Cont  Prozac 20 mg, 1 p.o. daily. Continue Seroquel 200 mg, 3 at bedtime.  Recommend counseling. Return in 6 weeks.  Melony Overly, PA-C

## 2023-09-19 ENCOUNTER — Encounter: Payer: Self-pay | Admitting: Physician Assistant

## 2023-09-19 ENCOUNTER — Ambulatory Visit (INDEPENDENT_AMBULATORY_CARE_PROVIDER_SITE_OTHER): Payer: No Typology Code available for payment source | Admitting: Physician Assistant

## 2023-09-19 DIAGNOSIS — F319 Bipolar disorder, unspecified: Secondary | ICD-10-CM

## 2023-09-19 DIAGNOSIS — F900 Attention-deficit hyperactivity disorder, predominantly inattentive type: Secondary | ICD-10-CM

## 2023-09-19 MED ORDER — AMPHETAMINE-DEXTROAMPHETAMINE 20 MG PO TABS: 10.0000 mg | ORAL_TABLET | Freq: Two times a day (BID) | ORAL | 0 refills | Status: DC

## 2023-09-19 MED ORDER — FLUOXETINE HCL 40 MG PO CAPS: 40.0000 mg | ORAL_CAPSULE | Freq: Every day | ORAL | 1 refills | Status: DC

## 2023-09-19 MED ORDER — QUETIAPINE FUMARATE 200 MG PO TABS: 600.0000 mg | ORAL_TABLET | Freq: Every day | ORAL | 1 refills | Status: DC

## 2023-09-19 NOTE — Progress Notes (Signed)
[facility]  Patient ID: Annette Mitchell,  MRN: [id number]  PCP: Annette Found, MD  Date of Evaluation: 09/19/2023 Time spent:20 minutes  Chief Complaint:  Chief Complaint   Follow-up    HISTORY/CURRENT STATUS: HPI  For f/u. Dtr is will her.   Her FIL died last week, fell off a roof. Very sad but she and fm are getting through it as best they can. She cries sometimes.  No feelings of hopelessness. Sleeps fairly well.  Patient is able to enjoy things.  Energy and motivation are good.  Doesn't work.  ADLs and personal hygiene are normal.   Appetite has not changed.  Weight is stable.   Denies suicidal or homicidal thoughts.  We restarted Adderall about 6 weeks ago. It's helped a lot. States that attention is good without easy distractibility.  Able to focus on things and finish tasks to completion.   Patient denies increased energy with decreased need for sleep, increased talkativeness, racing thoughts, impulsivity or risky behaviors, increased spending, increased libido, grandiosity, increased irritability or anger, paranoia, or hallucinations.  Denies dizziness, syncope, seizures, numbness, tingling, tremor, tics, unsteady gait, slurred speech, confusion. Denies muscle or joint pain, stiffness, or dystonia. Denies unexplained weight loss, frequent infections, or sores that heal slowly.  No polyphagia, polydipsia, or polyuria. Denies visual changes or paresthesias.   Individual Medical History/ Review of Systems: Changes? :No    Past medications for mental health diagnoses include: Cymbalta, Prozac, Zoloft, Celexa, Paxil, Lexapro, Pristiq caused paranoia,  Wellbutrin, lithium, Seroquel, Latuda, Lamictal, Vraylar caused migraines, Lunesta, Sonata, Restoril, Rexulti, Abilify, Ambien, Lithium caused severe nausea  Allergies: Latex, Betadine [povidone iodine], and Pristiq [desvenlafaxine]  Current Medications:  Current Outpatient Medications:    [START ON 10/18/2023]  amphetamine-dextroamphetamine (ADDERALL) 20 MG tablet, Take 0.5 tablets (10 mg total) by mouth 2 (two) times daily with breakfast and lunch., Disp: 30 tablet, Rfl: 0   FLUoxetine (PROZAC) 40 MG capsule, Take 1 capsule (40 mg total) by mouth daily., Disp: 30 capsule, Rfl: 1   metoprolol tartrate (LOPRESSOR) 50 MG tablet, TAKE 1 TABLET BY MOUTH EVERYDAY AT BEDTIME, Disp: 90 tablet, Rfl: 1   ondansetron (ZOFRAN-ODT) 4 MG disintegrating tablet, Take by mouth., Disp: , Rfl:    SUMAtriptan (IMITREX) 100 MG tablet, Take 100 mg by mouth daily as needed., Disp: , Rfl:    amphetamine-dextroamphetamine (ADDERALL) 20 MG tablet, Take 0.5 tablets (10 mg total) by mouth 2 (two) times daily with breakfast and lunch., Disp: 30 tablet, Rfl: 0   diclofenac (CATAFLAM) 50 MG tablet, Take 50 mg by mouth 2 (two) times daily. (Patient not taking: Reported on 08/09/2023), Disp: , Rfl:    EPINEPHrine 0.3 mg/0.3 mL IJ SOAJ injection, Inject into the muscle. (Patient not taking: Reported on 05/31/2023), Disp: , Rfl:    meloxicam (MOBIC) 15 MG tablet, Take 15 mg by mouth daily. (Patient not taking: Reported on 08/09/2023), Disp: , Rfl:    norethindrone (AYGESTIN) 5 MG tablet, Take 1 tablet (5 mg total) by mouth daily. (Patient not taking: Reported on 08/09/2023), Disp: 30 tablet, Rfl: 3   PROAIR HFA 108 (90 BASE) MCG/ACT inhaler, INHALE 2 PUFFS EVERY 4 TO 6 HOURS IF NEEDED (Patient not taking: Reported on 05/31/2023), Disp: , Rfl: 3   QUEtiapine (SEROQUEL) 200 MG tablet, Take 3 tablets (600 mg total) by mouth at bedtime., Disp: 270 tablet, Rfl: 1 Medication Side Effects: none  Family Medical/ Social History: Changes?  None  MENTAL HEALTH EXAM:  There were no  vitals taken for this visit.There is no height or weight on file to calculate BMI.  General Appearance: Casual, Well Groomed, and Obese  Eye Contact:  Good  Speech:  Clear and Coherent  Volume:  Normal  Mood:  Euthymic  Affect:  Congruent  Thought Process:  Goal Directed  and Descriptions of Associations: Circumstantial  Orientation:  Full (Time, Place, and Person)  Thought Content: Logical   Suicidal Thoughts:  No  Homicidal Thoughts:  No  Memory:  WNL  Judgement:  Good  Insight:  Good  Psychomotor Activity:  Normal  Concentration:  Concentration: Good and Attention Span: Good  Recall:  Good  Fund of Knowledge: Good  Language: Good  Assets:  Desire for Improvement  ADL's:  Intact  Cognition: WNL  Prognosis:  Good   DIAGNOSES:    ICD-10-CM   1. Bipolar I disorder (HCC)  F31.9     2. Attention deficit hyperactivity disorder (ADHD), predominantly inattentive type  F90.0      Receiving Psychotherapy: No   RECOMMENDATIONS:  PDMP was reviewed.  Adderall filled 08/09/2023. I provided 25 minutes of face to face time during this encounter, including time spent before and after the visit in records review, medical decision making, counseling pertinent to today's visit, and charting.   My condolences in the loss of her father-in law.   I recommend increasing the Prozac. She's taking a very low dose already. She would like to try it.   Contine Adderall 20 mg, 1/2 po with breakfast and lunch.  Increase Prozac to 40 mg, 1 p.o. daily. Continue Seroquel 200 mg, 3 at bedtime.  Recommend counseling. Return in 6-8 weeks.  Annette Overly, PA-C

## 2023-09-30 ENCOUNTER — Other Ambulatory Visit: Payer: Self-pay | Admitting: Adult Health

## 2023-09-30 ENCOUNTER — Other Ambulatory Visit: Payer: Self-pay | Admitting: Physician Assistant

## 2023-10-07 ENCOUNTER — Other Ambulatory Visit: Payer: Self-pay | Admitting: Physician Assistant

## 2023-12-26 ENCOUNTER — Other Ambulatory Visit: Payer: Self-pay | Admitting: Physician Assistant

## 2023-12-26 NOTE — Telephone Encounter (Signed)
Please schedule pt an appt lv 11/6 due back in 6-8 weeks.

## 2023-12-28 NOTE — Telephone Encounter (Signed)
Pt has scheduled for 02/19/24

## 2024-01-13 IMAGING — MG DIGITAL DIAGNOSTIC BILAT W/ TOMO W/ CAD
8 of 14 series · 8 of 40 positions shown · non-contrast
Comparison: None.

CLINICAL DATA: 40-year-old female with erythema and thickening
involving the periareolar/central left breast most noticeable along
the inner aspect of the left areola.

EXAM:
DIGITAL DIAGNOSTIC BILATERAL MAMMOGRAM WITH TOMOSYNTHESIS AND CAD;
ULTRASOUND LEFT BREAST LIMITED
TECHNIQUE: Bilateral digital diagnostic mammography and breast tomosynthesis
was performed. The images were evaluated with computer-aided
detection.; Targeted ultrasound examination of the left breast was
performed.

[R MLO synth-2D]
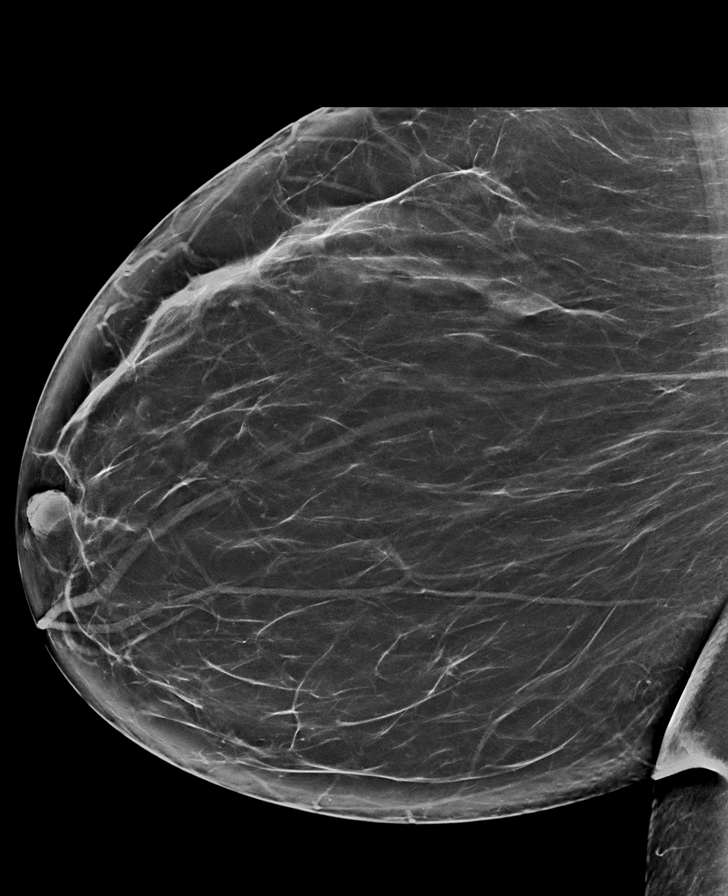

[L MLO synth-2D (1 of 2)]
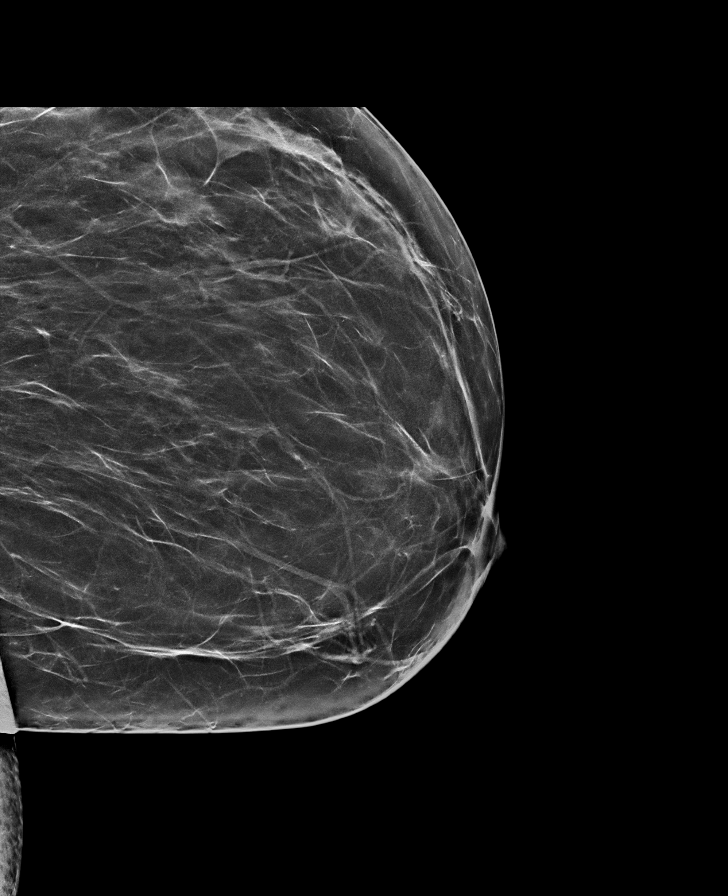

[L CC synth-2D (1 of 2)]
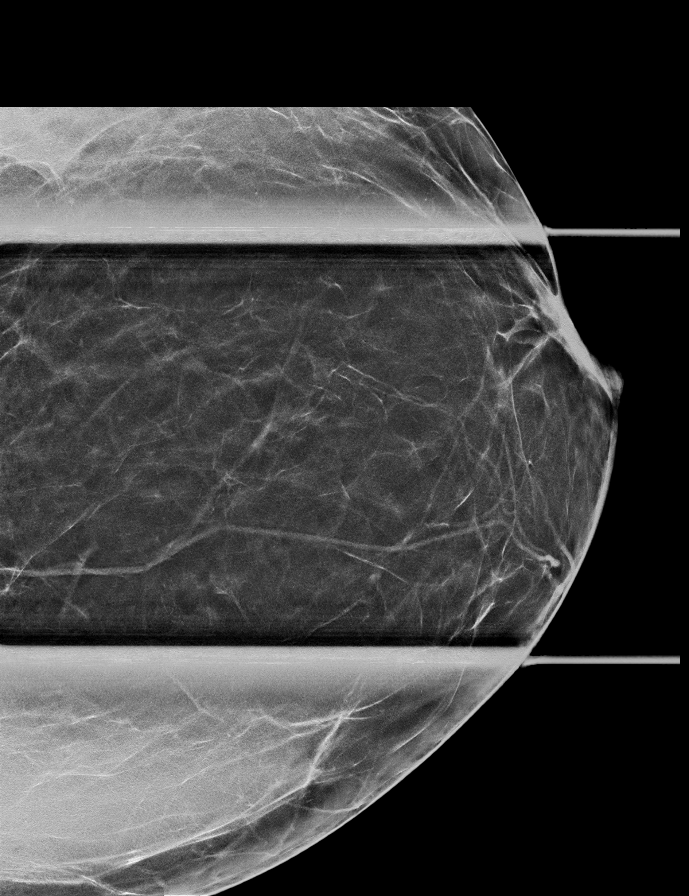

[R CC synth-2D]
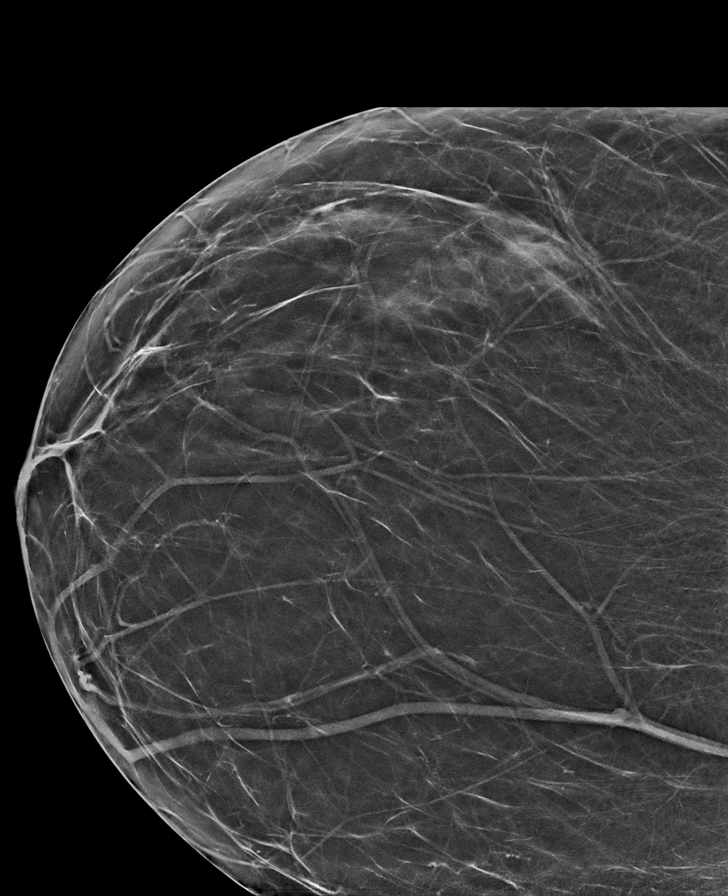

[L MLO synth-2D (2 of 2)]
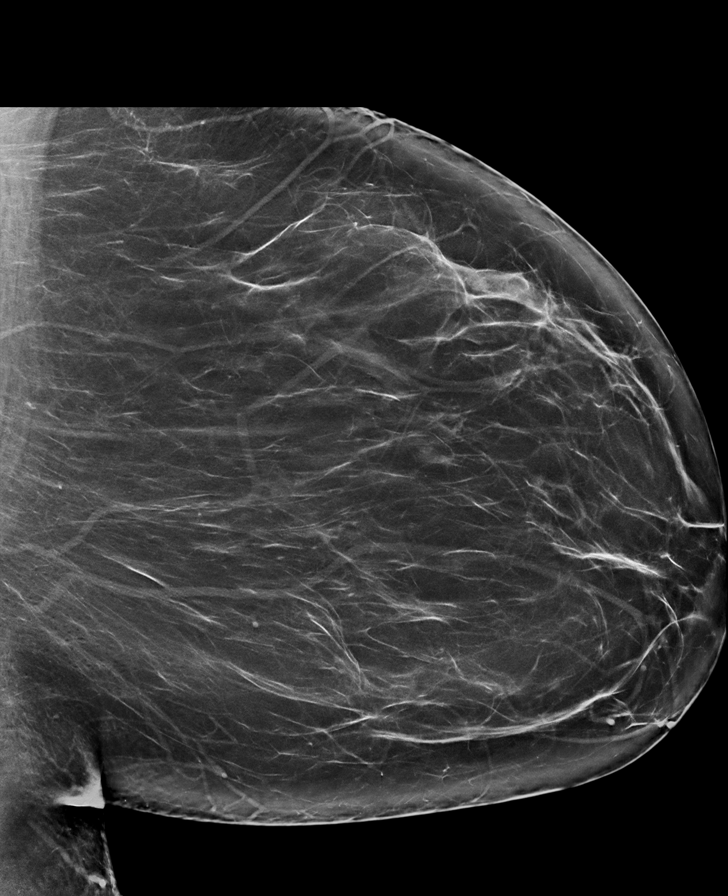

[R CV synth-2D]
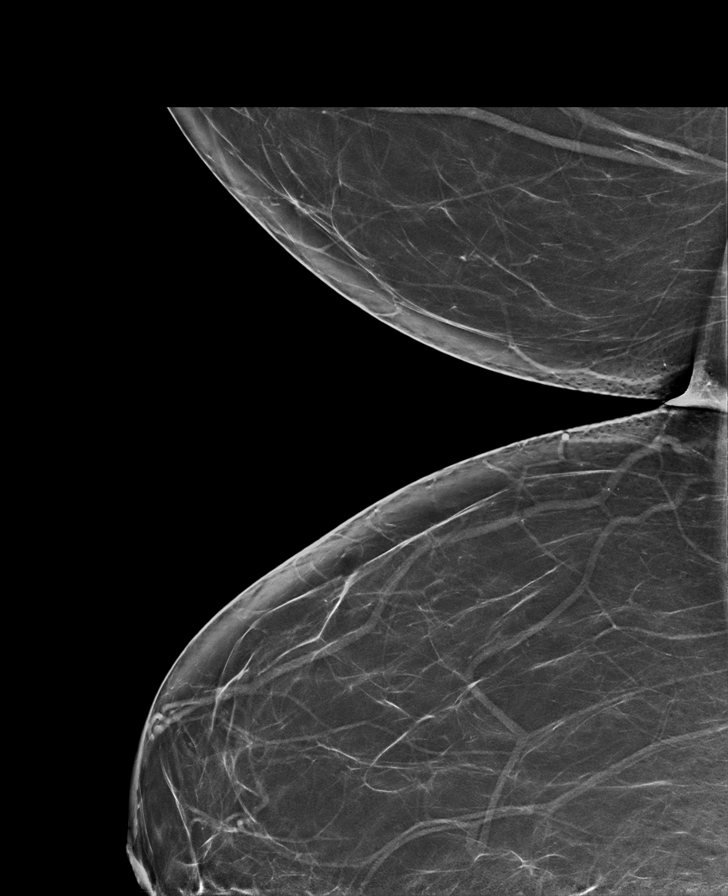

[L CC synth-2D (2 of 2)]
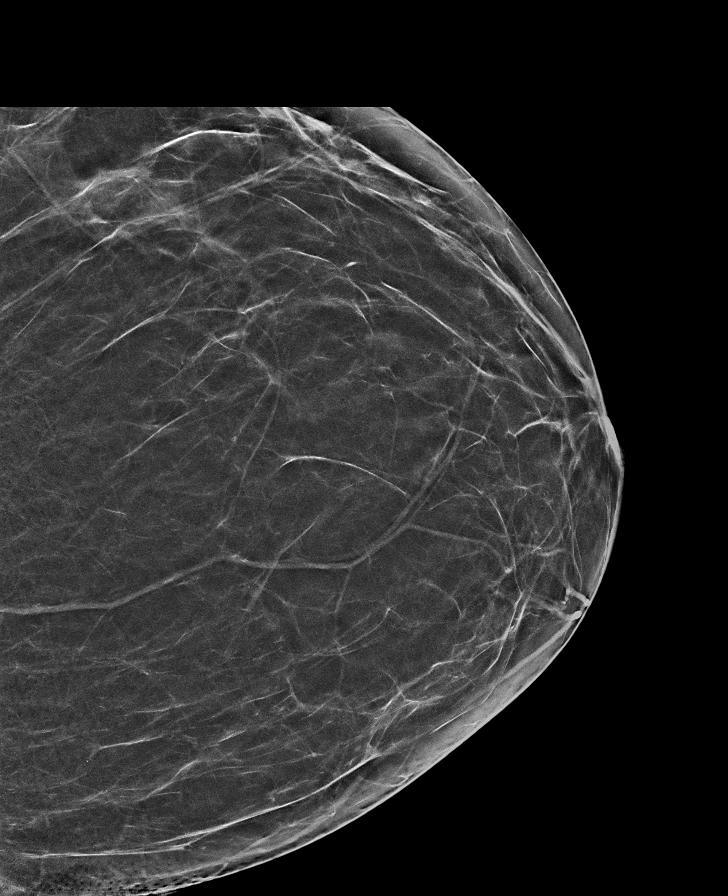

[L CC tomo · tomo slice 35/69.0]
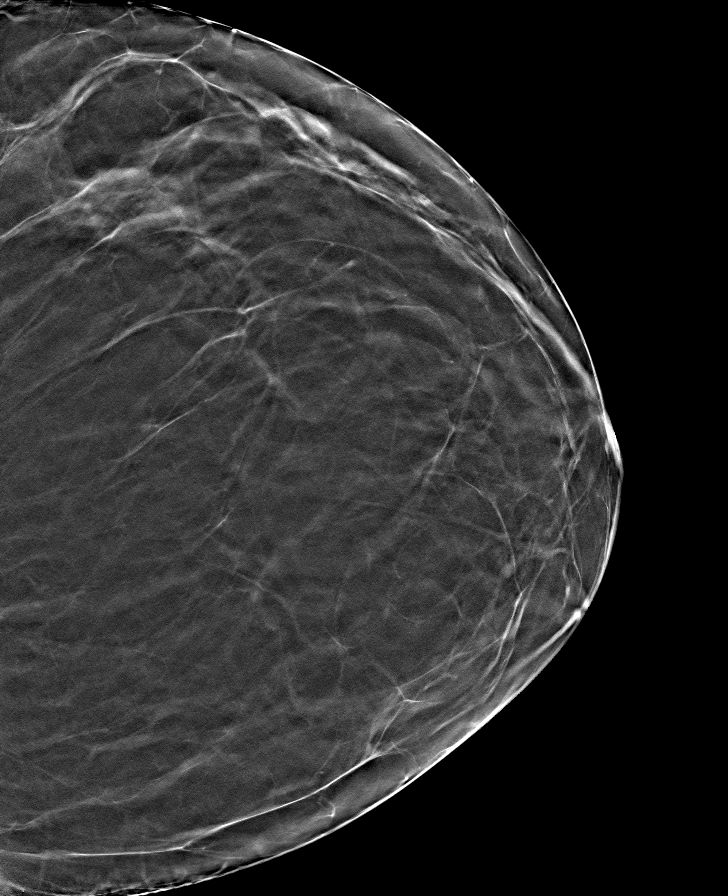

[8 of 40 positions shown; findings below may reference images not displayed]

ACR Breast Density Category b: There are scattered areas of
fibroglandular density.
FINDINGS: No suspicious masses or calcifications seen in either breast. Spot
compression tomograms were performed over the central left breast no
definite abnormality seen.

Physical examination reveals normal appearance of the left nipple
areola complex. There is no obvious erythema. No underlying palpable
masses.

Targeted breast was performed. No suspicious masses or abnormality
seen, only normal-appearing fibrofatty tissue identified.
IMPRESSION: 1. No mammographic or sonographic abnormalities at the site of
concern in the central to inner left breast.

2.  No mammographic evidence malignancy in either breast.

RECOMMENDATION:
1. Recommend further management of left breast symptoms be based on
clinical assessment.

2.  Screening mammogram in one year.(Code:[id number]-[id number])

I have discussed the findings and recommendations with the patient.
If applicable, a reminder letter will be sent to the patient
regarding the next appointment.

BI-RADS CATEGORY  1: Negative.

## 2024-01-23 ENCOUNTER — Other Ambulatory Visit: Payer: Self-pay

## 2024-01-23 ENCOUNTER — Telehealth: Payer: Self-pay | Admitting: Physician Assistant

## 2024-01-23 NOTE — Telephone Encounter (Signed)
 Pended Adderall 20 mg to [facility]

## 2024-01-23 NOTE — Telephone Encounter (Signed)
 Pt lvm that she needs a refill on her adderall 20 mg. [facility] is [facility] in [place]

## 2024-01-24 MED ORDER — AMPHETAMINE-DEXTROAMPHETAMINE 20 MG PO TABS: 10.0000 mg | ORAL_TABLET | Freq: Two times a day (BID) | ORAL | 0 refills | Status: DC

## 2024-02-15 ENCOUNTER — Other Ambulatory Visit: Payer: Self-pay | Admitting: Physician Assistant

## 2024-02-19 ENCOUNTER — Encounter: Payer: Self-pay | Admitting: Physician Assistant

## 2024-02-19 ENCOUNTER — Ambulatory Visit: Payer: No Typology Code available for payment source | Admitting: Physician Assistant

## 2024-02-19 DIAGNOSIS — F319 Bipolar disorder, unspecified: Secondary | ICD-10-CM

## 2024-02-19 DIAGNOSIS — F900 Attention-deficit hyperactivity disorder, predominantly inattentive type: Secondary | ICD-10-CM

## 2024-02-19 MED ORDER — AMPHETAMINE-DEXTROAMPHETAMINE 20 MG PO TABS: 10.0000 mg | ORAL_TABLET | Freq: Two times a day (BID) | ORAL | 0 refills | Status: DC

## 2024-02-19 NOTE — Progress Notes (Signed)
 [facility]  Patient ID: Annette Mitchell,  MRN: [id number]  PCP: Assunta Found, MD  Date of Evaluation: 02/19/2024 Time spent:25 minutes  Chief Complaint:  Chief Complaint   Depression; ADHD; Follow-up    Virtual Visit via Telehealth  I connected with patient by telephone, with their informed consent, and verified patient privacy and that I am speaking with the correct person using two identifiers.  I am private, in my office and the patient is at cousin's home.  I discussed the limitations, risks, security and privacy concerns of performing an evaluation and management service by telephone and the availability of in person appointments. I also discussed with the patient that there may be a patient responsible charge related to this service. The patient expressed understanding and agreed to proceed.   I discussed the assessment and treatment plan with the patient. The patient was provided an opportunity to ask questions and all were answered. The patient agreed with the plan and demonstrated an understanding of the instructions.   The patient was advised to call back or seek an in-person evaluation if the symptoms worsen or if the condition fails to improve as anticipated.  I provided 25 minutes of non-face-to-face time during this encounter.  HISTORY/CURRENT STATUS: HPI  For f/u.  She did not increase the Prozac as we had discussed back in November.  States she feels good and does not need a higher dose. Patient is able to enjoy things.  Energy and motivation are good.   No extreme sadness, tearfulness, or feelings of hopelessness.  Sleeps well most of the time. ADLs and personal hygiene are normal.  Appetite has not changed.  Weight is stable.  She takes phentermine some days.  She does not take it on the days that she takes Adderall.  No complaints of anxiety.  Denies suicidal or homicidal thoughts.  States that attention is good without easy distractibility.  Able to  focus on things and finish tasks to completion.   Patient denies increased energy with decreased need for sleep, increased talkativeness, racing thoughts, impulsivity or risky behaviors, increased spending, increased libido, grandiosity, increased irritability or anger, paranoia, or hallucinations.  Denies dizziness, syncope, seizures, numbness, tingling, tremor, tics, unsteady gait, slurred speech, confusion. Denies muscle or joint pain, stiffness, or dystonia. Denies unexplained weight loss, frequent infections, or sores that heal slowly.  No polyphagia, polydipsia, or polyuria. Denies visual changes or paresthesias.   Individual Medical History/ Review of Systems: Changes? :No    Past medications for mental health diagnoses include: Cymbalta, Prozac, Zoloft, Celexa, Paxil, Lexapro, Pristiq caused paranoia,  Wellbutrin, lithium, Seroquel, Latuda, Lamictal, Vraylar caused migraines, Lunesta, Sonata, Restoril, Rexulti, Abilify, Ambien, Lithium caused severe nausea  Allergies: Latex, Betadine [povidone iodine], and Pristiq [desvenlafaxine]  Current Medications:  Current Outpatient Medications:    amphetamine-dextroamphetamine (ADDERALL) 20 MG tablet, Take 0.5 tablets (10 mg total) by mouth 2 (two) times daily with breakfast and lunch., Disp: 30 tablet, Rfl: 0   amphetamine-dextroamphetamine (ADDERALL) 20 MG tablet, Take 0.5 tablets (10 mg total) by mouth 2 (two) times daily with breakfast and lunch., Disp: 30 tablet, Rfl: 0   diclofenac (CATAFLAM) 50 MG tablet, Take 50 mg by mouth 2 (two) times daily., Disp: , Rfl:    FLUoxetine (PROZAC) 40 MG capsule, Take 1 capsule (40 mg total) by mouth daily. (Patient taking differently: Take 20 mg by mouth daily.), Disp: 30 capsule, Rfl: 1   metoprolol tartrate (LOPRESSOR) 50 MG tablet, TAKE 1 TABLET BY MOUTH EVERYDAY  AT BEDTIME, Disp: 90 tablet, Rfl: 1   ondansetron (ZOFRAN-ODT) 4 MG disintegrating tablet, Take by mouth., Disp: , Rfl:    QUEtiapine  (SEROQUEL) 200 MG tablet, TAKE 3 TABLETS (600 MG TOTAL) BY MOUTH AT BEDTIME., Disp: 270 tablet, Rfl: 0   SUMAtriptan (IMITREX) 100 MG tablet, Take 100 mg by mouth daily as needed., Disp: , Rfl:    EPINEPHrine 0.3 mg/0.3 mL IJ SOAJ injection, Inject into the muscle. (Patient not taking: Reported on 02/19/2024), Disp: , Rfl:    meloxicam (MOBIC) 15 MG tablet, Take 15 mg by mouth daily. (Patient not taking: Reported on 02/19/2024), Disp: , Rfl:    norethindrone (AYGESTIN) 5 MG tablet, TAKE 1 TABLET (5 MG TOTAL) BY MOUTH DAILY. (Patient not taking: Reported on 02/19/2024), Disp: 90 tablet, Rfl: 1   PROAIR HFA 108 (90 BASE) MCG/ACT inhaler, INHALE 2 PUFFS EVERY 4 TO 6 HOURS IF NEEDED (Patient not taking: Reported on 02/19/2024), Disp: , Rfl: 3 Medication Side Effects: none  Family Medical/ Social History: Changes?  None  MENTAL HEALTH EXAM:  There were no vitals taken for this visit.There is no height or weight on file to calculate BMI.  General Appearance:  Unable to assess  Eye Contact:   Unable to assess  Speech:  Clear and Coherent  Volume:  Normal  Mood:  Euthymic  Affect:   Unable to assess  Thought Process:  Goal Directed and Descriptions of Associations: Circumstantial  Orientation:  Full (Time, Place, and Person)  Thought Content: Logical   Suicidal Thoughts:  No  Homicidal Thoughts:  No  Memory:  WNL   Judgement:  Good  Insight:  Good  Psychomotor Activity:   Unable to assess  Concentration:  Concentration: Good and Attention Span: Good  Recall:  Good  Fund of Knowledge: Good  Language: Good  Assets:  Desire for Improvement  ADL's:  Intact  Cognition: WNL  Prognosis:  Good   DIAGNOSES:    ICD-10-CM   1. Attention deficit hyperactivity disorder (ADHD), predominantly inattentive type  F90.0     2. Bipolar I disorder (HCC)  F31.9       Receiving Psychotherapy: No   RECOMMENDATIONS:  PDMP was reviewed.  Adderall filled 01/24/2024. Phentermine 02/13/2024. I provided 25 minutes  of non-face-to-face time during this encounter, including time spent before and after the visit in records review, medical decision making, counseling pertinent to today's visit, and charting.   She is doing well on current medications so no changes will be made.  Contine Adderall 20 mg, 1/2 po with breakfast and lunch.  Continue Prozac 20 mg, 1 p.o. daily. Take phentermine as needed per PCP, do not mix with Adderall. Continue Seroquel 200 mg, 3 at bedtime.  Return in 6 months.   Melony Overly, PA-C

## 2024-03-29 ENCOUNTER — Other Ambulatory Visit: Payer: Self-pay | Admitting: Physician Assistant

## 2024-04-23 ENCOUNTER — Ambulatory Visit (HOSPITAL_BASED_OUTPATIENT_CLINIC_OR_DEPARTMENT_OTHER): Admitting: Student

## 2024-05-01 ENCOUNTER — Encounter (HOSPITAL_BASED_OUTPATIENT_CLINIC_OR_DEPARTMENT_OTHER): Payer: Self-pay | Admitting: Student

## 2024-05-01 ENCOUNTER — Ambulatory Visit (HOSPITAL_BASED_OUTPATIENT_CLINIC_OR_DEPARTMENT_OTHER)

## 2024-05-01 ENCOUNTER — Ambulatory Visit (HOSPITAL_BASED_OUTPATIENT_CLINIC_OR_DEPARTMENT_OTHER): Admitting: Student

## 2024-05-01 DIAGNOSIS — M25531 Pain in right wrist: Secondary | ICD-10-CM

## 2024-05-01 DIAGNOSIS — M25331 Other instability, right wrist: Secondary | ICD-10-CM | POA: Diagnosis not present
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
## 2024-05-02 NOTE — Addendum Note (Signed)
 Addended by: Albesa Huguenin on: 05/02/2024 08:00 AM   Modules accepted: Orders
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
## 2024-06-09 ENCOUNTER — Encounter: Admitting: Orthopedic Surgery

## 2024-06-25 ENCOUNTER — Telehealth: Payer: Self-pay

## 2024-06-25 NOTE — Telephone Encounter (Addendum)
 Prior Authorization Amphetamine -Dextroamphetamine  20MG  tablets #30/30 [employer]  Approved Effective Date: 06/25/2024 Authorization Expiration Date: 06/25/2027  Pt notified.

## 2024-07-07 ENCOUNTER — Other Ambulatory Visit (INDEPENDENT_AMBULATORY_CARE_PROVIDER_SITE_OTHER): Payer: Self-pay

## 2024-07-07 ENCOUNTER — Ambulatory Visit: Admitting: Orthopedic Surgery

## 2024-07-07 DIAGNOSIS — M25531 Pain in right wrist: Secondary | ICD-10-CM

## 2024-07-07 DIAGNOSIS — M79645 Pain in left finger(s): Secondary | ICD-10-CM

## 2024-07-07 DIAGNOSIS — M25331 Other instability, right wrist: Secondary | ICD-10-CM

## 2024-07-07 DIAGNOSIS — R200 Anesthesia of skin: Secondary | ICD-10-CM

## 2024-07-07 MED ORDER — LIDOCAINE HCL 1 % IJ SOLN
1.0000 mL | INTRAMUSCULAR | Status: AC | PRN
  Administered 2024-07-07: 1 mL

## 2024-07-07 MED ORDER — BETAMETHASONE SOD PHOS & ACET 6 (3-3) MG/ML IJ SUSP
6.0000 mg | INTRAMUSCULAR | Status: AC | PRN
  Administered 2024-07-07: 6 mg via INTRA_ARTICULAR

## 2024-07-07 NOTE — Progress Notes (Signed)
 Annette Mitchell - 43 y.o. female MRN [id number]  Date of birth: October 04, 1981  Office Visit Note: Visit Date: 07/07/2024 PCP: Marvine Rush, MD Referred by: Genelle Standing, MD  Subjective: No chief complaint on file.  HPI: Annette Mitchell is a pleasant 43 y.o. female who presents today for evaluation of ongoing right wrist pain that is chronic in nature.  She has a history of a prior open duction internal fixation of the right wrist in 1988 at an outside facility, underwent subsequent hardware removal in 2005 for ongoing symptomatic hardware.  At that time, she states that a ulnar head resection was also performed given the significant ulnar positivity and impaction at that time.  Currently she is having ongoing pain at the wrist level with notable instability.  She notices clicking and catching with rotational movements of the wrist which is painful in nature.  She is also describing ongoing numbness of the ulnar aspect of the right hand which is progressive in nature.  She is describing nocturnal symptoms and numbness made worse in a bent elbow position that has been progressive over the past few months.  This has not undergone prior treatment or workup.  She is also describing some pain at the distal aspect of the left index finger with heavy pinch.  She does crocheting extensively which she feels may be contributing to her symptoms.  Has not undergone any recent treatments for the right wrist.  She is being seen by myself today for specific hand surgical evaluation.  Pertinent ROS were reviewed with the patient and found to be negative unless otherwise specified above in HPI.   Visit Reason:Right wrist pain Duration of symptoms: Multiple years, prior ORIF right wrist in 1988, subsequent hardware removal in 2005 for symptomatic hardware Hand dominance: right Occupation:no Diabetic: No Smoking: No Heart/Lung History:none Blood Thinners: none  Prior Testing/EMG:xray Injections  (Date):none Treatments:tried brace but made pain worse Prior Surgery:history of a right wrist fracture and ORIF in 1998 with subsequent hardware removal   Assessment & Plan: Visit Diagnoses:  1. DRUJ (distal radioulnar joint) instability, post-traumatic, right   2. Pain in right wrist   3. Pain in left finger(s)   4. Numbness of right hand     Plan: Extensive discussion was had with the patient today regarding her ongoing right wrist pain.  I reviewed the results of her x-rays, new x-rays were taken today as well which show ongoing degenerative changes at the distal radioulnar joint with scalloping at the interface between the distal ulna and radius.  There is notable instability on examination today as well as dorsal dislocation seen on the lateral film.  I explained that given the chronicity of this problem with the ongoing instability, she would be an appropriate candidate for Sauve Kapanji procedure in order to perform fusion at the distal radioulnar region for pain control and allow rotation through the ulnar osteotomy site.  We discussed risk and benefits of the surgery extensively as well as the postoperative recovery.  We also discussed conservative treatments extensively with bracing, injection, activity modification and anti-inflammatory medication both topical and oral.  For the time being, she states that her symptoms are somewhat tolerable.  She is interested in potential injection to the radiocarpal region for symptom relief.  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  I would like to further investigate this with electrodiagnostic study.  She will have  this done in the near future and return to me in the near future to discuss results.  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  We discussed topical  anti-inflammatory medication including Voltaren gel which can be utilized for this moving forward.  She has also been using a finger splint while crocheting which is fine.  I spent 45 minutes in the care of this patient today including review of previous documentation, imaging obtained, face-to-face time discussing all options regarding treatment and documenting the encounter.   Follow-up: No follow-ups on file.   Meds & Orders: No orders of the defined types were placed in this encounter.   Orders Placed This Encounter  Procedures   XR Wrist Complete Right   XR Finger Index Left   Ambulatory referral to Physical Medicine Rehab     Procedures: Medium Joint Inj: R radiocarpal on 07/07/2024 7:30 PM Indications: pain Details: 25 G needle, dorsal approach Medications: 1 mL lidocaine  1 %; 6 mg betamethasone  acetate-betamethasone  sodium phosphate 6 (3-3) MG/ML Outcome: tolerated well, no immediate complications Procedure, treatment alternatives, risks and benefits explained, specific risks discussed.          Clinical History: No specialty comments available.  She reports that she has never smoked. She has never used smokeless tobacco. No results for input(s): HGBA1C, LABURIC in the last 8760 hours.  Objective:   Vital Signs: There were no vitals taken for this visit.  Physical Exam  Gen: Well-appearing, in no acute distress; non-toxic CV: Regular Rate. Well-perfused. Warm.  Resp: Breathing unlabored on room air; no wheezing. Psych: Fluid speech in conversation; appropriate affect; normal thought process  Ortho Exam PHYSICAL EXAM:  General: Patient is well appearing and in no distress.  Skin and Muscle: Prior well-healed incisions to the right wrist. Muscle bulk and contour normal, no signs of atrophy.     Range of Motion and Palpation Tests: Mobility is full about the elbows with flexion and extension.  Forearm supination and pronation limited right wrist, 45/40, left  wrist 70/70.  Wrist flexion/extension is 75/65 bilaterally, pain at extremes of motion of the right wrist.  Digital flexion and extension are full.  Thumb opposition is full to the base of the small fingers bilaterally.    No cords or nodules are palpated.  No triggering is observed.    Notable tenderness over distal radial ulnar region of the right wrist is appreciated.  There is evidence of ongoing DRUJ instability particularly with the wrist in the pronated position in comparison to the contralateral side.    Neurologic, Vascular, Motor: Sensation is slightly diminished to light touch in the right ulnar distributions.  Tinel's testing positive at right cubital tunnel.    Negative Froment's, negative Wartenberg, no FDI wasting  Fingers pink and well perfused.  Capillary refill is brisk.      No results found for: HGBA1C   Imaging: XR Finger Index Left Result Date: 07/07/2024 Evidence of degenerative changes at the index PIP and DIP regions with joint space narrowing.  There is some bony overgrowth seen volarly along the middle phalanx region as well.  XR Wrist Complete Right Result Date: 07/07/2024 Right wrist demonstrates evidence of previous distal radius fracture fixation with well filled and screw holes, there is dysmorphic appearance to the distal radius as well and the distal ulnar region.  Scalloping of the distal radioulnar joint likely sequela of posttraumatic arthritis.  Past Medical/Family/Surgical/Social History: Medications & Allergies reviewed per EMR, new medications updated. Patient Active Problem List   Diagnosis Date Noted   Hot flashes 04/18/2023   Encounter for well woman exam with routine gynecological exam 04/18/2023   Irregular periods 04/18/2023   Pregnancy examination or test, negative result 04/18/2023   ASCUS of cervix with negative high risk HPV 12/23/2021   Breast pain, left 12/13/2021   Vulvar itching 12/13/2021   Encounter for screening fecal  occult blood testing 12/13/2021   Encounter for gynecological examination with Papanicolaou smear of cervix 12/13/2021   Frequent headaches 12/13/2021   Screening examination for STD (sexually transmitted disease) 12/13/2021   Need for hepatitis C screening test 12/13/2021   Palpitations 09/12/2021   Dependent edema 09/12/2021   Morbid obesity (HCC) 09/12/2021   Family history of early CAD 09/12/2021   OCD (obsessive compulsive disorder) 08/03/2018   Anxiety state 08/03/2018   Attention deficit disorder (ADD) 08/03/2018   Depressive disorder 09/03/2017   Vaginal pain 06/30/2015   Dyspareunia 06/30/2015   BV (bacterial vaginosis) 06/30/2015   Major depressive disorder, single episode 03/27/2000   Past Medical History:  Diagnosis Date   ASCUS of cervix with negative high risk HPV 12/23/2021   12/23/21 follow up in 3 years, 5  Year risk for CIN 3% is 0.40%    Asthma    BV (bacterial vaginosis) 06/30/2015   H. pylori infection    Headache    Hypertension    Vaginal pain 06/30/2015   Family History  Problem Relation Age of Onset   Stroke Paternal Grandmother    Cancer Maternal Grandmother    Cancer Maternal Grandfather    Heart attack Father    Mental illness Sister    Headache Daughter    Past Surgical History:  Procedure Laterality Date   ANKLE FRACTURE SURGERY     Left ankle   BIOPSY N/A 06/01/2015   Procedure: BIOPSY;  Surgeon: Oneil Budge, MD;  Location: [facility];  Service: Gastroenterology;  Laterality: N/A;   csections     x3   ESOPHAGOGASTRODUODENOSCOPY N/A 06/01/2015   Procedure: ESOPHAGOGASTRODUODENOSCOPY (EGD);  Surgeon: Oneil Budge, MD;  Location: [facility];  Service: Gastroenterology;  Laterality: N/A;   Leg surgery- Femur-metal implanted     TONSILLECTOMY     TUBAL LIGATION     uretheral surgery     Social History   Occupational History   Not on file  Tobacco Use   Smoking status: Never   Smokeless tobacco: Never  Vaping Use   Vaping status:  Never Used  Substance and Sexual Activity   Alcohol use: No   Drug use: No   Sexual activity: Yes    Birth control/protection: Surgical    Comment: tubes were removed    Emerson Schreifels Estela) Arlinda, M.D. [facility], Hand Surgery

## 2024-08-05 ENCOUNTER — Encounter: Admitting: Physical Medicine and Rehabilitation

## 2024-08-20 ENCOUNTER — Ambulatory Visit: Admitting: Physician Assistant

## 2024-09-11 ENCOUNTER — Ambulatory Visit: Admitting: Physician Assistant

## 2024-09-15 ENCOUNTER — Encounter: Payer: Self-pay | Admitting: Radiology

## 2024-09-24 ENCOUNTER — Other Ambulatory Visit: Payer: Self-pay | Admitting: Physician Assistant

## 2024-10-08 ENCOUNTER — Ambulatory Visit: Admitting: Physician Assistant

## 2024-10-08 ENCOUNTER — Encounter: Payer: Self-pay | Admitting: Physician Assistant

## 2024-10-08 DIAGNOSIS — F319 Bipolar disorder, unspecified: Secondary | ICD-10-CM

## 2024-10-08 DIAGNOSIS — F4321 Adjustment disorder with depressed mood: Secondary | ICD-10-CM

## 2024-10-08 DIAGNOSIS — F900 Attention-deficit hyperactivity disorder, predominantly inattentive type: Secondary | ICD-10-CM

## 2024-10-08 MED ORDER — AMPHETAMINE-DEXTROAMPHETAMINE 20 MG PO TABS: 10.0000 mg | ORAL_TABLET | Freq: Two times a day (BID) | ORAL | 0 refills | Status: AC

## 2024-10-08 MED ORDER — FLUOXETINE HCL 40 MG PO CAPS: 40.0000 mg | ORAL_CAPSULE | Freq: Every day | ORAL | 1 refills | Status: AC

## 2024-10-08 NOTE — Progress Notes (Signed)
 [facility]  Patient ID: Annette Mitchell,  MRN: [id number]  PCP: Marvine Rush, MD  Date of Evaluation: 10/08/2024 Time spent:25 minutes  Chief Complaint:  Chief Complaint   ADHD; Anxiety; Depression; Follow-up    Virtual Visit via Telehealth  I connected with patient by telephone, with their informed consent, and verified patient privacy and that I am speaking with the correct person using two identifiers.  I am private, in my office and the patient is at cousin's home.  I discussed the limitations, risks, security and privacy concerns of performing an evaluation and management service by telephone and the availability of in person appointments. I also discussed with the patient that there may be a patient responsible charge related to this service. The patient expressed understanding and agreed to proceed.   I discussed the assessment and treatment plan with the patient. The patient was provided an opportunity to ask questions and all were answered. The patient agreed with the plan and demonstrated an understanding of the instructions.   The patient was advised to call back or seek an in-person evaluation if the symptoms worsen or if the condition fails to improve as anticipated.  I provided 25 minutes of non-face-to-face time during this encounter.  HISTORY/CURRENT STATUS: HPI  For f/u.  Has been sad.  Her sister died of unknown causes since our last visit.  Her sister-in-law also died, secondary to a seizure.  [name] states she has her good days and bad days but good more than bad.  She is not taking the Prozac  every day, sometimes just a few times a week and that is helpful.  No panic attacks.  She is not working by choice.  Energy and motivation are good most of the time.  No extreme sadness, tearfulness, or feelings of hopelessness.  Sleeps okay.  ADLs and personal hygiene are normal.  Adderall is still effective.  States that attention is good without easy  distractibility.  Able to focus on things and finish tasks to completion.   Appetite has not changed.  Weight is stable.  Anxiety is controlled.  No mania, delirium, AH/VH.  No SI/HI.  Individual Medical History/ Review of Systems: Changes? :No    Past medications for mental health diagnoses include: Cymbalta, Prozac , Zoloft, Celexa, Paxil, Lexapro, Pristiq caused paranoia,  Wellbutrin, lithium , Seroquel , Latuda, Lamictal, Vraylar caused migraines, Lunesta, Sonata , Restoril, Rexulti, Abilify, Ambien, Lithium  caused severe nausea  Allergies: Latex, Betadine [povidone iodine], and Pristiq [desvenlafaxine]  Current Medications:  Current Outpatient Medications:    metoprolol  tartrate (LOPRESSOR ) 50 MG tablet, TAKE 1 TABLET BY MOUTH EVERYDAY AT BEDTIME, Disp: 90 tablet, Rfl: 1   QUEtiapine  (SEROQUEL ) 200 MG tablet, TAKE 3 TABLETS (600 MG TOTAL) BY MOUTH AT BEDTIME., Disp: 270 tablet, Rfl: 1   SUMAtriptan (IMITREX) 100 MG tablet, Take 100 mg by mouth daily as needed., Disp: , Rfl:    [START ON 12/05/2024] amphetamine -dextroamphetamine  (ADDERALL) 20 MG tablet, Take 0.5 tablets (10 mg total) by mouth 2 (two) times daily with breakfast and lunch., Disp: 30 tablet, Rfl: 0   [START ON 11/04/2024] amphetamine -dextroamphetamine  (ADDERALL) 20 MG tablet, Take 0.5 tablets (10 mg total) by mouth 2 (two) times daily with breakfast and lunch., Disp: 30 tablet, Rfl: 0   amphetamine -dextroamphetamine  (ADDERALL) 20 MG tablet, Take 0.5 tablets (10 mg total) by mouth 2 (two) times daily with breakfast and lunch., Disp: 30 tablet, Rfl: 0   diclofenac (CATAFLAM) 50 MG tablet, Take 50 mg by mouth 2 (two) times daily.,  Disp: , Rfl:    EPINEPHrine 0.3 mg/0.3 mL IJ SOAJ injection, Inject into the muscle. (Patient not taking: Reported on 02/19/2024), Disp: , Rfl:    FLUoxetine  (PROZAC ) 40 MG capsule, Take 1 capsule (40 mg total) by mouth daily., Disp: 90 capsule, Rfl: 1   meloxicam (MOBIC) 15 MG tablet, Take 15 mg by mouth daily.  (Patient not taking: Reported on 02/19/2024), Disp: , Rfl:    norethindrone  (AYGESTIN ) 5 MG tablet, TAKE 1 TABLET (5 MG TOTAL) BY MOUTH DAILY. (Patient not taking: Reported on 02/19/2024), Disp: 90 tablet, Rfl: 1   ondansetron (ZOFRAN-ODT) 4 MG disintegrating tablet, Take by mouth., Disp: , Rfl:    PROAIR HFA 108 (90 BASE) MCG/ACT inhaler, INHALE 2 PUFFS EVERY 4 TO 6 HOURS IF NEEDED (Patient not taking: Reported on 02/19/2024), Disp: , Rfl: 3 Medication Side Effects: none  Family Medical/ Social History: Changes?  SIL and sister died since LOV   MENTAL HEALTH EXAM:  There were no vitals taken for this visit.There is no height or weight on file to calculate BMI.  General Appearance: Unable to assess  Eye Contact:  Unable to assess  Speech:  Clear and Coherent  Volume:  Normal  Mood:  Euthymic  Affect:  Unable to assess  Thought Process:  Goal Directed and Descriptions of Associations: Circumstantial  Orientation:  Full (Time, Place, and Person)  Thought Content: Logical   Suicidal Thoughts:  No  Homicidal Thoughts:  No  Memory:  WNL   Judgement:  Good  Insight:  Good  Psychomotor Activity:  Unable to assess  Concentration:  Concentration: Good and Attention Span: Good  Recall:  Good  Fund of Knowledge: Good  Language: Good  Assets:  Communication Skills Desire for Improvement Financial Resources/Insurance Housing Resilience Transportation  ADL's:  Intact  Cognition: WNL  Prognosis:  Good   PCP follows labs  DIAGNOSES:    ICD-10-CM   1. Attention deficit hyperactivity disorder (ADHD), predominantly inattentive type  F90.0     2. Bipolar I disorder (HCC)  F31.9     3. Grief  F43.21       Receiving Psychotherapy: No   RECOMMENDATIONS:  PDMP was reviewed.  Adderall filled 06/29/2024.  Phentermine filled 02/13/2024. I provided approximately 25 minutes of non-face to face time during this encounter, including time spent before and after the visit in records review, medical  decision making, counseling pertinent to today's visit, and charting.   We discussed the Prozac .  It is intended to be taken daily, however due to its long half-life taking it a few times a week is fine.  I will leave the prescription directions the same however.  Contine Adderall 20 mg, 1/2 po with breakfast and lunch.  Continue Prozac  40 mg, 1 p.o. daily. Take phentermine as needed per PCP, do not mix with Adderall. Continue Seroquel  200 mg, 3 at bedtime.  Return in 6 months.   Verneita Cooks, PA-C

## 2024-11-20 ENCOUNTER — Ambulatory Visit (INDEPENDENT_AMBULATORY_CARE_PROVIDER_SITE_OTHER)

## 2024-11-20 ENCOUNTER — Ambulatory Visit (INDEPENDENT_AMBULATORY_CARE_PROVIDER_SITE_OTHER): Admitting: Student

## 2024-11-20 DIAGNOSIS — M25562 Pain in left knee: Secondary | ICD-10-CM | POA: Diagnosis not present

## 2024-11-20 DIAGNOSIS — M25561 Pain in right knee: Secondary | ICD-10-CM

## 2024-11-20 DIAGNOSIS — G8929 Other chronic pain: Secondary | ICD-10-CM | POA: Diagnosis not present

## 2024-11-20 MED ORDER — LIDOCAINE HCL 1 % IJ SOLN
4.0000 mL | INTRAMUSCULAR | Status: AC | PRN
  Administered 2024-11-20: 4 mL

## 2024-11-20 MED ORDER — BETAMETHASONE SOD PHOS & ACET 6 (3-3) MG/ML IJ SUSP
2.0000 mL | INTRAMUSCULAR | Status: AC | PRN
  Administered 2024-11-20: 2 mL via INTRA_ARTICULAR
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
## 2024-12-25 ENCOUNTER — Ambulatory Visit: Admitting: Adult Health
